# Patient Record
Sex: Female | Born: 1965 | Race: White | Hispanic: No | Marital: Married | Smoking: Current every day smoker
Health system: Southern US, Community
[De-identification: ages and names within clinical notes are randomized; demographics above are authoritative.]

## PROBLEM LIST (undated history)

## (undated) DIAGNOSIS — K219 Gastro-esophageal reflux disease without esophagitis: Secondary | ICD-10-CM

## (undated) DIAGNOSIS — IMO0001 Reserved for inherently not codable concepts without codable children: Secondary | ICD-10-CM

## (undated) DIAGNOSIS — G629 Polyneuropathy, unspecified: Secondary | ICD-10-CM

## (undated) DIAGNOSIS — F988 Other specified behavioral and emotional disorders with onset usually occurring in childhood and adolescence: Secondary | ICD-10-CM

## (undated) DIAGNOSIS — D649 Anemia, unspecified: Secondary | ICD-10-CM

## (undated) HISTORY — DX: Other specified behavioral and emotional disorders with onset usually occurring in childhood and adolescence: F98.8

## (undated) HISTORY — PX: TUBAL LIGATION: SHX77

## (undated) HISTORY — DX: Polyneuropathy, unspecified: G62.9

## (undated) HISTORY — PX: OTHER SURGICAL HISTORY: SHX169

---

## 2009-10-12 ENCOUNTER — Ambulatory Visit (HOSPITAL_BASED_OUTPATIENT_CLINIC_OR_DEPARTMENT_OTHER): Admission: RE | Admit: 2009-10-12 | Discharge: 2009-10-12 | Payer: Self-pay | Admitting: Podiatry

## 2010-09-18 ENCOUNTER — Emergency Department (HOSPITAL_COMMUNITY): Payer: Self-pay

## 2010-09-18 ENCOUNTER — Emergency Department (HOSPITAL_COMMUNITY)
Admission: EM | Admit: 2010-09-18 | Discharge: 2010-09-18 | Disposition: A | Discharge status: Home / Self Care | Payer: Self-pay | Attending: Emergency Medicine | Admitting: Emergency Medicine

## 2010-09-18 DIAGNOSIS — W010XXA Fall on same level from slipping, tripping and stumbling without subsequent striking against object, initial encounter: Secondary | ICD-10-CM | POA: Insufficient documentation

## 2010-09-18 DIAGNOSIS — M47812 Spondylosis without myelopathy or radiculopathy, cervical region: Secondary | ICD-10-CM | POA: Insufficient documentation

## 2010-09-18 DIAGNOSIS — S0990XA Unspecified injury of head, initial encounter: Secondary | ICD-10-CM | POA: Insufficient documentation

## 2010-09-18 DIAGNOSIS — R112 Nausea with vomiting, unspecified: Secondary | ICD-10-CM | POA: Insufficient documentation

## 2010-09-18 DIAGNOSIS — S0100XA Unspecified open wound of scalp, initial encounter: Secondary | ICD-10-CM | POA: Insufficient documentation

## 2010-09-23 ENCOUNTER — Emergency Department (HOSPITAL_COMMUNITY)
Admission: EM | Admit: 2010-09-23 | Discharge: 2010-09-23 | Disposition: A | Discharge status: Home / Self Care | Payer: Self-pay | Attending: Emergency Medicine | Admitting: Emergency Medicine

## 2010-09-23 DIAGNOSIS — Z4802 Encounter for removal of sutures: Secondary | ICD-10-CM | POA: Insufficient documentation

## 2010-10-17 LAB — POCT HEMOGLOBIN-HEMACUE: Hemoglobin: 14.8 g/dL (ref 12.0–15.0)

## 2011-09-25 ENCOUNTER — Telehealth: Payer: Self-pay | Admitting: *Deleted

## 2011-09-25 NOTE — Telephone Encounter (Signed)
Wrong patient.... 09/25/11@3 :32pm/LMB

## 2013-06-03 ENCOUNTER — Other Ambulatory Visit: Payer: Self-pay | Admitting: Family Medicine

## 2013-06-03 ENCOUNTER — Ambulatory Visit
Admission: RE | Admit: 2013-06-03 | Discharge: 2013-06-03 | Disposition: A | Discharge status: Home / Self Care | Payer: Managed Care, Other (non HMO) | Source: Ambulatory Visit | Attending: Family Medicine | Admitting: Family Medicine

## 2013-06-03 DIAGNOSIS — R52 Pain, unspecified: Secondary | ICD-10-CM

## 2013-06-16 ENCOUNTER — Encounter: Payer: Managed Care, Other (non HMO) | Admitting: Family Medicine

## 2013-06-16 ENCOUNTER — Encounter: Payer: Self-pay | Admitting: Family Medicine

## 2013-06-16 NOTE — Progress Notes (Signed)
Error   This encounter was created in error - please disregard. 

## 2013-06-16 NOTE — Progress Notes (Deleted)
Pre visit review using our clinic review tool, if applicable. No additional management support is needed unless otherwise documented below in the visit note. 

## 2013-10-20 ENCOUNTER — Ambulatory Visit (INDEPENDENT_AMBULATORY_CARE_PROVIDER_SITE_OTHER): Payer: Managed Care, Other (non HMO) | Admitting: Neurology

## 2013-10-20 ENCOUNTER — Encounter: Payer: Self-pay | Admitting: Neurology

## 2013-10-20 VITALS — BP 142/80 | HR 74 | Temp 98.0°F | Ht 67.0 in | Wt 234.1 lb

## 2013-10-20 DIAGNOSIS — R413 Other amnesia: Secondary | ICD-10-CM

## 2013-10-20 DIAGNOSIS — F909 Attention-deficit hyperactivity disorder, unspecified type: Secondary | ICD-10-CM

## 2013-10-20 NOTE — Patient Instructions (Addendum)
1. Bloodwork for TSH, free T4, B12 2. MRI brain without contrast 3. Routine EEG 4. Follow-up in 4 weeks

## 2013-10-20 NOTE — Progress Notes (Signed)
NEUROLOGY CONSULTATION NOTE  Amber Mcmahon MRN: [id number] DOB: 25-Nov-1965  Referring provider: Dr. Marisue Brooklyn Primary care provider: Dr. Kriste Basque  Reason for consult:  Neurological evaluation for poor memory, easy distractibility, poor concentration  Dear Dr Elisabeth Most:  Thank you for your kind referral of Amber Mcmahon for consultation of the above symptoms. Although her history is well known to you, please allow me to reiterate it for the purpose of our medical record. Records and images were personally reviewed where available.  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  She presents with easy distractibility, forgetfulness, and poor concentration/attention.  She reports this has been going on for "as long as I can remember," however she was not diagnosed until she went back to college in 2011.  Her instructors had a meeting about her because she was noted to have problems in class, she would get easily distracted if there is a noise, had difficulty sitting still.  She can sit quietly and listen to the teacher, however when asked a questions, she has "zero recollection" of what he just said.  She then states that she has no problems recalling what she needs in the grocery store or what she did the day before.  She constantly loses things. Periodically, people have to remind her about details of conversation and events.  She has been told by her husband that she repeats things excessively.  She denies missing medications or paying bills, stating she has a routine and keeps precise notes.  She also keeps notes or records conversations, otherwise she would not remember them.  She uses her GPS to drive, but would get slightly confused where to turn without it.  When asked about multi-tasking, she reports difficulty with this because she would get distracted by one activity, then  forget that she was going to do another thing.  She has noticed word-finding difficulties since her mid-40s, for instance she wanted to say "paper" but says "that white square thing sitting there."  She has no difficulties with ADLs, cooks without problems following written instructions, she has not left the stove on. She became tearful discussing her symptoms.  She reports that she had tried Vyvanse and Adderall for a while, with low doses seeming to minimally help for a few hours.  She was switched to Strattera which did not help at all, "like I was taking a vitamin."  She tapered off of this and stopped it last week.  She has also reduced her coffee intake to 2 double shots of espresso a day, with note of more difficulties with concentration and headaches when she took less coffee.  She has also significantly reduced Nexium intake to 4/week, in that past taking it 3-4 times a day.  She reports her daughter has the same inattentiveness.  Her grandmother had progressive dementia.    She denies any significant headaches except when missing caffeine, no dizziness, diplopia, dysarthria, dysphagia, focal numbness/tingling/weakness.  She has infrequent balance issues when standing. She has neck and back pain, no bowel/bladder dysfunction.  She denies any staring/unresponsive episodes, gaps in time, olfactory/gustatory hallucinations, rising epigastric sensation, myoclonic jerks.  She reports cold intolerance, hair loss, and a strong family history of thyroid disease.    She had a normal birth and early development.  There is no history of febrile convulsions, CNS infections, significant traumatic brain injury, family history of seizures.  She fell 2  years ago with very brief loss of consciousness and needed some staples in the back of her head.  PAST MEDICAL HISTORY: Past Medical History  Diagnosis Date  . ADD (attention deficit disorder)     PAST SURGICAL HISTORY: History reviewed. No pertinent past  surgical history.  MEDICATIONS: Nexium Tramadol   No current facility-administered medications on file prior to visit.    ALLERGIES: No Known Allergies  FAMILY HISTORY: History reviewed. No pertinent family history.  SOCIAL HISTORY: History   Social History  . Marital Status: Married    Spouse Name: N/A    Number of Children: N/A  . Years of Education: N/A   Occupational History  . Not on file.   Social History Main Topics  . Smoking status: Current Every Day Smoker -- 0.50 packs/day    Types: Cigarettes    Start date: 07/25/1971  . Smokeless tobacco: Not on file  . Alcohol Use: Yes  . Drug Use: No  . Sexual Activity: Not on file   Other Topics Concern  . Not on file   Social History Narrative  . No narrative on file    REVIEW OF SYSTEMS: Constitutional: No fevers, chills, or sweats, no generalized fatigue, change in appetite Eyes: No visual changes, double vision, eye pain Ear, nose and throat: No hearing loss, ear pain, nasal congestion, sore throat Cardiovascular: No chest pain, palpitations Respiratory:  No shortness of breath at rest or with exertion, wheezes GastrointestinaI: No nausea, vomiting, diarrhea, abdominal pain, fecal incontinence Genitourinary:  No dysuria, urinary retention or frequency Musculoskeletal:  No neck pain, back pain Integumentary: No rash, pruritus, skin lesions Neurological: as above Psychiatric: No depression, insomnia, anxiety Endocrine: No palpitations, fatigue, diaphoresis, mood swings, change in appetite, change in weight, increased thirst Hematologic/Lymphatic:  No anemia, purpura, petechiae. Allergic/Immunologic: no itchy/runny eyes, nasal congestion, recent allergic reactions, rashes  PHYSICAL EXAM: Filed Vitals:   10/20/13 1018  BP: 142/80  Pulse: 74  Temp: 98 F (36.7 C)   General: No acute distress Head:  Normocephalic/atraumatic Neck: supple, no paraspinal tenderness, full range of motion Back: No  paraspinal tenderness Heart: regular rate and rhythm Lungs: Clear to auscultation bilaterally. Vascular: No carotid bruits. Skin/Extremities: No rash, no edema Neurological Exam: Mental status: alert and oriented to person, place, and time, no dysarthria or aphasia, Fund of knowledge is appropriate.  Remote memory are intact.  Attention and concentration are normal.    Able to name objects and repeat phrases. MOCA 28/30 (missed 2 points for delayed recall). Cranial nerves: CN I: not tested CN II: pupils equal, round and reactive to light, visual fields intact, fundi unremarkable. CN III, IV, VI:  full range of motion, no nystagmus, no ptosis CN V: facial sensation intact CN VII: upper and lower face symmetric CN VIII: hearing intact CN IX, X: gag intact, uvula midline CN XI: sternocleidomastoid and trapezius muscles intact CN XII: tongue midline Bulk & Tone: normal, no fasciculations. Motor: 5/5 throughout with no pronator drift. Sensation: intact to light touch, cold, pin, vibration and joint position sense.  No extinction to double simultaneous stimulation.  Romberg test negative Deep Tendon Reflexes: +2 throughout, no ankle clonus Plantar responses: downgoing bilaterally Cerebellar: no incoordination on finger to nose, heel to shin. No dysdiadochokinesia Gait: narrow-based and steady, able to tandem walk adequately. Tremor: none  IMPRESSION: This is a 48 year old right-handed woman with a history of ADHD presenting for neurological evaluation of forgetfulness, inattention, and easy distractibility, unresponsive to ADHD medications.  Her MOCA score is within normal, with missed points for delayed recall.  Her neurological exam is normal.  We discussed memory changes, an MRI brain will be ordered to assess for underlying structural abnormality that could potentially cause symptoms, check TSH, T4, B12. Seizures can also cause memory issues, she does not have clear epilepsy risk factors or  other seizure-like symptoms aside from memory problems.  A routine EEG will be ordered.  We discussed symptoms most likely related to the ADD, however primary neurological cause of memory change will be ruled out.  She may benefit from neuropsychological evaluation if not yet done.  She will follow-up after the tests.    Thank you for allowing me to participate in the care of this patient. Please do not hesitate to call for any questions or concerns.   Patrcia DollyKaren Kamar Callender, M.D.

## 2013-10-21 LAB — T4, FREE: Free T4: 1.04 ng/dL (ref 0.80–1.80)

## 2013-10-21 LAB — TSH: TSH: 1.77 u[IU]/mL (ref 0.350–4.500)

## 2013-10-21 LAB — VITAMIN B12: Vitamin B-12: 764 pg/mL (ref 211–911)

## 2013-10-29 ENCOUNTER — Ambulatory Visit (HOSPITAL_COMMUNITY)
Admission: RE | Admit: 2013-10-29 | Discharge: 2013-10-29 | Disposition: A | Discharge status: Home / Self Care | Payer: Managed Care, Other (non HMO) | Source: Ambulatory Visit | Attending: Neurology | Admitting: Neurology

## 2013-10-29 DIAGNOSIS — R413 Other amnesia: Secondary | ICD-10-CM

## 2013-10-29 NOTE — Progress Notes (Signed)
EEG Completed; Results Pending  

## 2013-11-04 NOTE — Procedures (Signed)
ELECTROENCEPHALOGRAM REPORT  Date of Study: 10/29/2013  Patient's Name: Amber Mcmahon MRN: [id number] Date of Birth: 1965-12-20  Referring Provider: Dr. Patrcia DollyKaren Hartleigh Mcmahon  Clinical History: This is a 48 year old right-handed woman with a history of ADHD presenting for neurological evaluation of forgetfulness, inattention, and easy distractibility, unresponsive to ADHD medications   Medications: Tramadol  Technical Summary: A multichannel digital EEG recording measured by the international 10-20 system with electrodes applied with paste and impedances below 5000 ohms performed in our laboratory with EKG monitoring in an awake and drowsy patient.  Hyperventilation and photic stimulation were performed.  The digital EEG was referentially recorded, reformatted, and digitally filtered in a variety of bipolar and referential montages for optimal display.  Spike detection software was employed.  Description: The patient is awake and drowsy during the recording.  During maximal wakefulness, there is a symmetric, medium voltage 11-11.5 Hz posterior dominant rhythm that attenuates with eye opening.  The record is symmetric.  During drowsiness, there is an increase in theta slowing of the background, with shifting asymmetry seen over the bilateral temporal regions, left greater than right. Hyperventilation and photic stimulation did not elicit any abnormalities.  There were no epileptiform discharges or electrographic seizures seen.    EKG lead was unremarkable.  Impression: This awake and drowsy EEG is within normal limits.  Clinical Correlation: A normal EEG does not exclude a clinical diagnosis of epilepsy.  Clinical correlation is advised.   Amber Mcmahon, M.D.

## 2013-11-07 ENCOUNTER — Ambulatory Visit (HOSPITAL_COMMUNITY): Payer: Managed Care, Other (non HMO)

## 2013-11-17 ENCOUNTER — Ambulatory Visit (HOSPITAL_COMMUNITY): Admission: RE | Admit: 2013-11-17 | Payer: Managed Care, Other (non HMO) | Source: Ambulatory Visit

## 2014-01-14 ENCOUNTER — Ambulatory Visit (INDEPENDENT_AMBULATORY_CARE_PROVIDER_SITE_OTHER): Payer: Managed Care, Other (non HMO) | Admitting: Neurology

## 2014-01-14 ENCOUNTER — Encounter: Payer: Self-pay | Admitting: Neurology

## 2014-01-14 ENCOUNTER — Encounter (INDEPENDENT_AMBULATORY_CARE_PROVIDER_SITE_OTHER): Payer: Self-pay

## 2014-01-14 VITALS — BP 123/79 | HR 68 | Resp 15 | Ht 65.75 in | Wt 228.0 lb

## 2014-01-14 DIAGNOSIS — G5790 Unspecified mononeuropathy of unspecified lower limb: Secondary | ICD-10-CM | POA: Insufficient documentation

## 2014-01-14 DIAGNOSIS — G5791 Unspecified mononeuropathy of right lower limb: Secondary | ICD-10-CM

## 2014-01-14 NOTE — Progress Notes (Signed)
[facility]   Provider:  Melvyn Novasarmen  Emmersyn Kratzke, [place] D  Referring Provider: Terressa KoyanagiKim, Hannah R., DO Primary Care Physician:  Astrid DivineGRIFFIN,ELAINE COLLINS, MD  Chief Complaint  Patient presents with  . New Evaluation    Room 11  . Neuritis    HPI:  Amber AveCharlene Mcmahon is a 48 y.o. female  Is seen here as a referral  from the [facility], Merwyn KatosJohn Petery, podiatrist.  The patient has just been seen by a neurologist, Dr. Karel JarvisAquino, at [facility] - for memory and attention problems and has not completed her work up yet. She was referred to Dr. Aletha HalimAndreas Runheim per podiatrist.  Amber Mcmahon  Presents with long and protracted history of right-sided foot pain. The patient describes that the first injury to the right foot occurred when she tripped over a root and twisted her ankle , 5 years ago. A year after the injury , surgery followed and she lost insurance and follow up . Surgery has not helped. The condition is chronic for over 5 years . Only 2 month ago, while she was on her honeymoon in [place] , she tripped again. She tripped while dancing, and has had several falls.  The pain was unchanged , just her feeling of clumsiness has increased. She feels pain, and is constantly aware of her foot - at times the foot dropped or "got stuck". She has been given frequent injections into the heel and external ankle, but these have not worked or only worked temporarily to suppress pain.    She has a history of multifocal pain, muscular, joint and tendonitis.  ADHD, seen by Dr. Karel JarvisAquino.  She is a massage therapist, smoker, newly married.  2 biological and one adopted child form a former marriage .    Review of Systems: Out of a complete 14 system review, the patient complains of only the following symptoms, and all other reviewed systems are negative. Foot pain, falls   History   Social History  . Marital Status: Married    Spouse Name: Amber JohnWarren    Number of Children: 3  . Years of  Education: College   Occupational History  . Not on file.   Social History Main Topics  . Smoking status: Current Every Day Smoker -- 0.50 packs/day    Types: Cigarettes    Start date: 07/25/1971  . Smokeless tobacco: Never Used  . Alcohol Use: Yes     Comment: occas.  . Drug Use: No  . Sexual Activity: Not on file   Other Topics Concern  . Not on file   Social History Narrative   Patient is married Amber John(Warren) and lives at home with her husband.   Patient is self-employed.   Patient has three children.   Patient has a college education.   Patient is right-handed.   Patient drinks 5 cups of coffee daily.    Family History  Problem Relation Age of Onset  . Dementia Maternal Grandmother     Past Medical History  Diagnosis Date  . ADD (attention deficit disorder)   . Neuropathy     Past Surgical History  Procedure Laterality Date  . Foot/ankle surgery Right     Current Outpatient Prescriptions  Medication Sig Dispense Refill  . amphetamine-dextroamphetamine (ADDERALL XR) 25 MG 24 hr capsule 1 capsule daily.      . ranitidine (ZANTAC) 300 MG tablet Take 300 mg by mouth at bedtime.       No current facility-administered medications for this  visit.    Allergies as of 01/14/2014  . (No Known Allergies)    Vitals: BP 123/79  Pulse 68  Resp 15  Ht 5' 5.75" (1.67 m)  Wt 228 lb (103.42 kg)  BMI 37.08 kg/m2 Last Weight:  Wt Readings from Last 1 Encounters:  01/14/14 228 lb (103.42 kg)   Last Height:   Ht Readings from Last 1 Encounters:  01/14/14 5' 5.75" (1.67 m)    Physical exam:  General: The patient is awake, alert and appears not in acute distress. The patient is well groomed. Head: Normocephalic, atraumatic. Neck is supple. Mallampati 3 , neck circumference:17 inches, no TMJ  Cardiovascular:  Regular rate and rhythm , without  murmurs or carotid bruit, and without distended neck veins. Respiratory: Lungs are clear to auscultation. Skin:  Without  evidence of edema, or rash Trunk: BMI is elevated and patient  has normal posture.  Neurologic exam : The patient is awake and alert, oriented to place and time.  She is extroverted, speaks with vivid mimic, attention span & concentration ability were not adressed . Speech is fluent, dramatic  without dysarthria, dysphonia or aphasia. Mood and affect are appropriate.  Cranial nerves: Pupils are equal and briskly reactive to light. Funduscopic exam without  evidence of pallor or edema. Extraocular movements  in vertical and horizontal planes intact and without nystagmus. Visual fields by finger perimetry are intact. Hearing to finger rub intact.  Facial sensation intact to fine touch. Facial motor strength is symmetric and tongue and uvula move midline.  Motor exam:   Normal foot ROM, toe movements and inversion, exertion. No foot drop noted.   Sensory:  Fine touch, pinprick and vibration were tested in all extremities, vibration decreased at external right ankle. Proprioception is  normal.  Coordination: Rapid alternating movements in the fingers/hands is tested and normal.  Finger-to-nose maneuver tested and normal without evidence of ataxia, dysmetria or tremor.  Gait and station: Patient walks without assistive device and to climb up to the exam table.  Foot position on the left is inverted,  Strength within normal limits. Stance is  Wider basd, feet are flat , no hammertoes. stable and normal. Tandem gait is unsteady , fragmented. Component of astasia, abasia.  Deep tendon reflexes: in the  upper and lower extremities are symmetric and intact. Babinski maneuver response is  downgoing.   Assessment:  After physical and neurologic examination, review of laboratory studies, imaging, neurophysiology testing and pre-existing records, assessment is   1) status post foot surgery with remaining pain, will evaluate for neuropathy by NCS and EMG (peroneal ) and refer this work up to  one of the  neuromuscular specialists in my office.   Plan:  Treatment plan and additional workup : start a Vit B complex, and NCS and EMG are ordered.

## 2014-01-14 NOTE — Patient Instructions (Signed)

## 2014-01-15 LAB — NEUROPATHY PANEL
A/G Ratio: 1.7 (ref 0.7–2.0)
Albumin ELP: 4.1 g/dL (ref 3.2–5.6)
Alpha 1: 0.1 g/dL (ref 0.1–0.4)
Alpha 2: 0.6 g/dL (ref 0.4–1.2)
Angio Convert Enzyme: 38 U/L (ref 14–82)
Anti Nuclear Antibody(ANA): NEGATIVE
Beta: 1.1 g/dL (ref 0.6–1.3)
Gamma Globulin: 0.6 g/dL (ref 0.5–1.6)
Globulin, Total: 2.4 g/dL (ref 2.0–4.5)
Rhuematoid fact SerPl-aCnc: 12.3 IU/mL (ref 0.0–13.9)
Sed Rate: 7 mm/hr (ref 0–40)
TSH: 1.67 u[IU]/mL (ref 0.450–4.500)
Total Protein: 6.5 g/dL (ref 6.0–8.5)
Vit D, 25-Hydroxy: 23.4 ng/mL — ABNORMAL LOW (ref 30.0–100.0)
Vitamin B-12: 587 pg/mL (ref 211–946)

## 2014-01-16 NOTE — Progress Notes (Signed)
Quick Note:  Spoke to patient and relayed lab results, low Vit D level. The patient is to take OTC Vitamin D, 2000 IU twice a day until recheck, per Dr. Vickey Hugerohmeier. ______

## 2014-01-22 ENCOUNTER — Other Ambulatory Visit: Payer: Self-pay | Admitting: Family Medicine

## 2014-01-22 DIAGNOSIS — M7989 Other specified soft tissue disorders: Secondary | ICD-10-CM

## 2014-01-22 DIAGNOSIS — R52 Pain, unspecified: Secondary | ICD-10-CM

## 2014-01-28 ENCOUNTER — Ambulatory Visit
Admission: RE | Admit: 2014-01-28 | Discharge: 2014-01-28 | Disposition: A | Discharge status: Home / Self Care | Payer: Managed Care, Other (non HMO) | Source: Ambulatory Visit | Attending: Family Medicine | Admitting: Family Medicine

## 2014-01-28 DIAGNOSIS — R52 Pain, unspecified: Secondary | ICD-10-CM

## 2014-01-28 DIAGNOSIS — M7989 Other specified soft tissue disorders: Secondary | ICD-10-CM

## 2014-01-29 ENCOUNTER — Telehealth: Payer: Self-pay | Admitting: *Deleted

## 2014-01-29 NOTE — Telephone Encounter (Signed)
Dr. Anne HahnWillis has appointment and needs 7/21 4pm EMG slot blocked/rescheduled. Left message for patient to call back to reschedule visit.

## 2014-02-10 ENCOUNTER — Encounter: Payer: Managed Care, Other (non HMO) | Admitting: Neurology

## 2014-02-12 ENCOUNTER — Telehealth: Payer: Self-pay | Admitting: *Deleted

## 2014-02-12 NOTE — Telephone Encounter (Signed)
Patient returning my call, appointment was r/s for 02/24/14 at 2:30 pm.

## 2014-02-12 NOTE — Telephone Encounter (Signed)
Called patient to r/s appointment, patient has EMG/NCS scheduled for 02/18/14, her appointment needs to be after the NCS, left message to return the call.

## 2014-02-13 ENCOUNTER — Ambulatory Visit: Payer: Managed Care, Other (non HMO) | Admitting: Adult Health

## 2014-02-18 ENCOUNTER — Ambulatory Visit (INDEPENDENT_AMBULATORY_CARE_PROVIDER_SITE_OTHER): Payer: Managed Care, Other (non HMO) | Admitting: Neurology

## 2014-02-18 ENCOUNTER — Encounter (INDEPENDENT_AMBULATORY_CARE_PROVIDER_SITE_OTHER): Payer: Self-pay

## 2014-02-18 DIAGNOSIS — M79671 Pain in right foot: Secondary | ICD-10-CM

## 2014-02-18 DIAGNOSIS — Z0289 Encounter for other administrative examinations: Secondary | ICD-10-CM

## 2014-02-18 DIAGNOSIS — M79609 Pain in unspecified limb: Secondary | ICD-10-CM

## 2014-02-18 DIAGNOSIS — G5791 Unspecified mononeuropathy of right lower limb: Secondary | ICD-10-CM

## 2014-02-18 NOTE — Procedures (Signed)
     HISTORY:  Amber Mcmahon is a 48 year old patient with a history of right foot pain with weightbearing. The patient had surgery on the right foot 3 years ago, and she has had ongoing discomfort. The patient is being evaluated for possible neuropathy or a lumbosacral radiculopathy. The patient denies any significant back pain or pain down the leg.  NERVE CONDUCTION STUDIES:  Nerve conduction studies were performed on both lower extremities. The distal motor latencies and motor amplitudes for the peroneal and posterior tibial nerves were within normal limits. The nerve conduction velocities for these nerves were also normal. The H reflex latencies were normal. The sensory latencies for the peroneal nerves, and the medial and lateral plantar sensory latencies were within normal limits bilaterally.   EMG STUDIES:  EMG study was performed on the right lower extremity:  The tibialis anterior muscle reveals 2 to 4K motor units with full recruitment. No fibrillations or positive waves were seen. The peroneus tertius muscle reveals 2 to 5K motor units with decreased recruitment. No fibrillations or positive waves were seen. The medial gastrocnemius muscle reveals 1 to 3K motor units with full recruitment. No fibrillations or positive waves were seen. The vastus lateralis muscle reveals 2 to 4K motor units with full recruitment. No fibrillations or positive waves were seen. The iliopsoas muscle reveals 2 to 4K motor units with full recruitment. No fibrillations or positive waves were seen. The biceps femoris muscle (long head) reveals 2 to 3K motor units with full recruitment. No fibrillations or positive waves were seen. The lumbosacral paraspinal muscles were tested at 3 levels, and revealed no abnormalities of insertional activity at all 3 levels tested. There was good relaxation.   IMPRESSION:  Nerve studies done on both lower extremities were within normal limits. No evidence of a  peripheral neuropathy is seen. EMG evaluation of the right lower extremity is relatively unremarkable, without clear evidence of an overlying lumbosacral radiculopathy.  Marlan Palau. Keith Sarra Rachels MD 02/18/2014 1:50 PM  [facility] [address] [place], [place] [id number]  Phone [phone] Fax [phone]

## 2014-02-24 ENCOUNTER — Ambulatory Visit (INDEPENDENT_AMBULATORY_CARE_PROVIDER_SITE_OTHER): Payer: Managed Care, Other (non HMO) | Admitting: Adult Health

## 2014-02-24 ENCOUNTER — Encounter: Payer: Self-pay | Admitting: Adult Health

## 2014-02-24 ENCOUNTER — Telehealth: Payer: Self-pay | Admitting: Adult Health

## 2014-02-24 VITALS — BP 128/78 | HR 66 | Ht 65.0 in | Wt 226.0 lb

## 2014-02-24 DIAGNOSIS — R52 Pain, unspecified: Secondary | ICD-10-CM

## 2014-02-24 DIAGNOSIS — G5790 Unspecified mononeuropathy of unspecified lower limb: Secondary | ICD-10-CM

## 2014-02-24 DIAGNOSIS — G5791 Unspecified mononeuropathy of right lower limb: Secondary | ICD-10-CM

## 2014-02-24 MED ORDER — DULOXETINE HCL 30 MG PO CPEP: ORAL_CAPSULE | ORAL | Status: DC

## 2014-02-24 NOTE — Telephone Encounter (Signed)
Vicky with [facility] @ [phone], wanted to make [facility] aware that pt requested recent labs per Dr. Oliva Bustardohmeier's request.  They have not drawn any blood work.  If you have questions please feel free to contact her.  Thanks

## 2014-02-24 NOTE — Patient Instructions (Signed)

## 2014-02-24 NOTE — Progress Notes (Signed)
PATIENT: Amber Mcmahon DOB: 1966/03/12  REASON FOR VISIT: follow up HISTORY FROM: patient  HISTORY OF PRESENT ILLNESS: Amber Mcmahon is a 48 year old female with a history of right foot discomfort. She returns today for follow up. Patient had NCS with EMG that was unremarkable. Blood work was normal except a low Vit D level. She reports that her foot pain has continued. Patient is very tearful and states that she has pain all over. Patient states that she has had pain in the left arm and found a lump in the upper part of the arm. She had scans done but they were unremarkable. The pain eventually went away in the left arm but moved to the right arm. Patient states that she has not slept on the right side in some time. Her PCP has ordered an MRI of that shoulder. She is now having pain back in the left shoulder region. She states that the right shoulder is no longer hurting like it was. She states that her skin is very sensitive to touch. This occurs mainly in the shoulder region and upper arm bilaterally.  Patient states that she does have some numbness and tingling in the right foot but states that it is not constant. Patient has been seen by various physicians over the last couple of months.   REVIEW OF SYSTEMS: Full 14 system review of systems performed and notable only for:  Constitutional: Excessive sweating Eyes: N/A Ear/Nose/Throat: N/A  Skin: N/A  Cardiovascular: Leg swelling Respiratory: N/A  Gastrointestinal: N/A  Genitourinary: N/A Hematology/Lymphatic: N/A  Endocrine: Cold intolerance, heat intolerance Musculoskeletal: Joint pain, joint swelling, back pain, aching muscles, muscle cramps, walking difficulty Allergy/Immunology: N/A  Neurological: N/A Psychiatric: N/A Sleep: N/A   ALLERGIES: No Known Allergies  HOME MEDICATIONS: Outpatient Prescriptions Prior to Visit  Medication Sig Dispense Refill  . amphetamine-dextroamphetamine (ADDERALL XR) 25 MG 24 hr capsule 1  capsule daily.      . ranitidine (ZANTAC) 300 MG tablet Take 300 mg by mouth at bedtime.       No facility-administered medications prior to visit.    PAST MEDICAL HISTORY: Past Medical History  Diagnosis Date  . ADD (attention deficit disorder)   . Neuropathy     PAST SURGICAL HISTORY: Past Surgical History  Procedure Laterality Date  . Foot/ankle surgery Right     FAMILY HISTORY: Family History  Problem Relation Age of Onset  . Dementia Maternal Grandmother     SOCIAL HISTORY: History   Social History  . Marital Status: Married    Spouse Name: Amber Mcmahon    Number of Children: 3  . Years of Education: College   Occupational History  .      teacher,massage therapy   Social History Main Topics  . Smoking status: Current Every Day Smoker -- 1.00 packs/day    Types: Cigarettes    Start date: 07/25/1971  . Smokeless tobacco: Never Used  . Alcohol Use: Yes     Comment: occas.  . Drug Use: No  . Sexual Activity: Not on file   Other Topics Concern  . Not on file   Social History Narrative   Patient is married Amber Mcmahon) and lives at home with her husband.   Patient is self-employed.   Patient has three children.   Patient has a college education.   Patient is right-handed.   Patient drinks 5 cups of coffee daily.      PHYSICAL EXAM  Filed Vitals:   02/24/14 1440  BP: 128/78  Pulse: 66  Height: 5\' 5"  (1.651 m)  Weight: 226 lb (102.513 kg)   Body mass index is 37.61 kg/(m^2).  Generalized: Well developed, very tearful.   Neurological examination  Mentation: Alert oriented to time, place, history taking. Follows all commands speech and language fluent Cranial nerve II-XII:  Extraocular movements were full, visual field were full on confrontational test.  Head turning and shoulder shrug  were normal and symmetric. Motor: The motor testing reveals 5 over 5 strength of all 4 extremities. Good symmetric motor tone is noted throughout.  Tenderness is noted  when I pressed down on elbows and when lifting up the knee. Pain with flexion and extension of neck. Sensory: Sensory testing is intact to soft touch on all 4 extremities. No evidence of extinction is noted.  Coordination: Cerebellar testing reveals good finger-nose-finger and heel-to-shin bilaterally.  Gait and station: Gait is slightly wide based. Tandem gait is unsteady. Romberg is negative but unsteady. No drift is seen.  Reflexes: Deep tendon reflexes are symmetric and normal bilaterally.     DIAGNOSTIC DATA (LABS, IMAGING, TESTING) - I reviewed patient records, labs, notes, testing and imaging myself where available.  Lab Results  Component Value Date   HGB 14.8 10/12/2009      Component Value Date/Time   PROT 6.5 01/14/2014 1112    Lab Results  Component Value Date   VITAMINB12 587 01/14/2014   Lab Results  Component Value Date   TSH 1.670 01/14/2014      ASSESSMENT AND PLAN 48 y.o. year old female  has a past medical history of ADD (attention deficit disorder) and Neuropathy. here with:  1. Neuritis of right foot 2. Generalized pain  Patient is very tearful. She states that she has pain "all over." She is seeing us for right foot pain but states that recently she has been having pain in the right and left arm that will alternate. She has had a "scan" of her left arm that was unremarkable according to the patient. She has an MRI scheduled for the right shoulder. She continues to have pain in the right foot. NCS with EMG was normal. She has had blood work that was unremarkable. Rheumatoid factor, angiotensin-converting enzyme, sedimentation rate were all normal. The patient's primary care provider has also done a series of scans and blood work on the patient. I currently do not have access to these test, I have requested that the patient get this information sent to us. The patient currently takes Vicodin and ibuprofen to help with the pain. Her symptoms could represent a  chronic pain syndrome such as fibromyalgia. I will give the patient a trial on Cymbalta to see if it is beneficial for her pain.  Patient should follow up in 4 months or sooner if needed.   Amber PennyMegan Willaim Mode, MSN, NP-C 02/24/2014, 2:42 PM [facility] [address], [address] [place], [place] [address] [phone]  Note: This document was prepared with digital dictation and possible smart phrase technology. Any transcriptional errors that result from this process are unintentional.

## 2014-02-24 NOTE — Telephone Encounter (Signed)
Noted  

## 2014-02-25 NOTE — Progress Notes (Signed)
I agree with the assessment and plan as directed by NP .The patient is known to me .   Hadlea Furuya, MD  

## 2014-03-10 ENCOUNTER — Telehealth: Payer: Self-pay | Admitting: *Deleted

## 2014-03-10 DIAGNOSIS — G5790 Unspecified mononeuropathy of unspecified lower limb: Secondary | ICD-10-CM

## 2014-03-10 NOTE — Telephone Encounter (Signed)
Pt called.  Stated that she wanted an appt with Dr. Vickey Hugerohmeier.  Saw Tylene FantasiaMegan M, NP but felt like she did not address her issues.  Looking at the chart, I see that labs (only Vitamin D low ), and [place]/EMG normal. Started on cymbalta for fibromylgia sx.

## 2014-03-11 NOTE — Telephone Encounter (Signed)
I called pt and let her know that I did not see request for other labs.  (RA, gout?).  She wanted to see Dr. Vickey Huger again after her initial appt as Aundra Millet M/NP did not address these other labs.  She relayed that someone told her that these would be addressed.  Who?  I do not see note in chart regarding this.  Other MD?   I faxed to Dr. Elvin So, [facility] notes, labs and procedure done on pt.  Will check with Dr.Dohmeier on appt and then call her back.

## 2014-03-12 NOTE — Telephone Encounter (Signed)
Called and LMVM for pt that I spoke to Dr. Vickey Hugerohmeier.  She will order the test for gout, and other for autoimmune (Sjogrens syndrome).  Labs to be drawn in office.  Dr. Vickey Hugerohmeier to speak to her when in for labs.  Appt to be set when Dr. Vickey Hugerohmeier here.

## 2014-03-13 ENCOUNTER — Other Ambulatory Visit: Payer: Self-pay

## 2014-03-19 ENCOUNTER — Other Ambulatory Visit: Payer: Self-pay

## 2014-03-20 ENCOUNTER — Other Ambulatory Visit (INDEPENDENT_AMBULATORY_CARE_PROVIDER_SITE_OTHER): Payer: Self-pay

## 2014-03-20 DIAGNOSIS — G5790 Unspecified mononeuropathy of unspecified lower limb: Secondary | ICD-10-CM

## 2014-03-20 DIAGNOSIS — Z0289 Encounter for other administrative examinations: Secondary | ICD-10-CM

## 2014-03-22 LAB — SJOGREN'S SYNDROME ANTIBODS(SSA + SSB)
ENA SSA (RO) Ab: <0.2 AI (ref 0.0–0.9)
ENA SSB (LA) Ab: <0.2 AI (ref 0.0–0.9)

## 2014-03-22 LAB — URIC ACID: Uric Acid: 7.4 mg/dL — ABNORMAL HIGH (ref 2.5–7.1)

## 2014-04-03 ENCOUNTER — Telehealth: Payer: Self-pay | Admitting: *Deleted

## 2014-04-03 NOTE — Telephone Encounter (Signed)
Patient was given her lab results and faxed to Dr. Valentina Lucks to address the uric acid level.

## 2014-04-03 NOTE — Telephone Encounter (Signed)
Left a voice message for the patient to call the office for lab results.

## 2014-04-03 NOTE — Telephone Encounter (Signed)
Message copied by Carolann Littler on Fri Apr 03, 2014  4:31 PM ------      Message from: [facility], CARMEN      Created: Wed Apr 01, 2014  4:31 PM       The elevated uric acid speaks for a higher risk of Gout,  You tested negative for sjogren's disease .      CD ------

## 2014-06-30 ENCOUNTER — Ambulatory Visit: Payer: Managed Care, Other (non HMO) | Admitting: Adult Health

## 2014-08-01 ENCOUNTER — Encounter (HOSPITAL_COMMUNITY): Payer: Self-pay | Admitting: Emergency Medicine

## 2014-08-01 ENCOUNTER — Emergency Department (HOSPITAL_COMMUNITY): Payer: Managed Care, Other (non HMO)

## 2014-08-01 ENCOUNTER — Emergency Department (HOSPITAL_COMMUNITY)
Admission: EM | Admit: 2014-08-01 | Discharge: 2014-08-01 | Disposition: A | Discharge status: Home / Self Care | Payer: Managed Care, Other (non HMO) | Attending: Emergency Medicine | Admitting: Emergency Medicine

## 2014-08-01 DIAGNOSIS — G629 Polyneuropathy, unspecified: Secondary | ICD-10-CM | POA: Diagnosis not present

## 2014-08-01 DIAGNOSIS — Z79899 Other long term (current) drug therapy: Secondary | ICD-10-CM | POA: Insufficient documentation

## 2014-08-01 DIAGNOSIS — F909 Attention-deficit hyperactivity disorder, unspecified type: Secondary | ICD-10-CM | POA: Insufficient documentation

## 2014-08-01 DIAGNOSIS — M549 Dorsalgia, unspecified: Secondary | ICD-10-CM | POA: Diagnosis present

## 2014-08-01 DIAGNOSIS — M5417 Radiculopathy, lumbosacral region: Secondary | ICD-10-CM | POA: Diagnosis not present

## 2014-08-01 DIAGNOSIS — Z720 Tobacco use: Secondary | ICD-10-CM | POA: Insufficient documentation

## 2014-08-01 MED ORDER — OXYCODONE-ACETAMINOPHEN 5-325 MG PO TABS: 1.0000 | ORAL_TABLET | ORAL | Status: DC | PRN

## 2014-08-01 MED ORDER — DIAZEPAM 5 MG PO TABS
5.0000 mg | ORAL_TABLET | Freq: Once | ORAL | Status: AC
  Administered 2014-08-01: 5 mg via ORAL
  Filled 2014-08-01: qty 1

## 2014-08-01 MED ORDER — OXYCODONE-ACETAMINOPHEN 5-325 MG PO TABS
1.0000 | ORAL_TABLET | Freq: Once | ORAL | Status: AC
  Administered 2014-08-01: 1 via ORAL
  Filled 2014-08-01: qty 1

## 2014-08-01 MED ORDER — DEXAMETHASONE SODIUM PHOSPHATE 10 MG/ML IJ SOLN
10.0000 mg | Freq: Once | INTRAMUSCULAR | Status: AC
  Administered 2014-08-01: 10 mg via INTRAMUSCULAR
  Filled 2014-08-01: qty 1

## 2014-08-01 MED ORDER — HYDROMORPHONE HCL 1 MG/ML IJ SOLN
1.0000 mg | Freq: Once | INTRAMUSCULAR | Status: AC
  Administered 2014-08-01: 1 mg via INTRAMUSCULAR
  Filled 2014-08-01: qty 1

## 2014-08-01 MED ORDER — DIAZEPAM 5 MG PO TABS: 5.0000 mg | ORAL_TABLET | Freq: Three times a day (TID) | ORAL | Status: DC | PRN

## 2014-08-01 NOTE — ED Provider Notes (Signed)
CSN: [id number]     Arrival date & time 08/01/14  7829 History   First MD Initiated Contact with Patient 08/01/14 [phone]     Chief Complaint  Patient presents with  . Leg Pain     (Consider location/radiation/quality/duration/timing/severity/associated sxs/prior Treatment) HPI Amber Mcmahon is a 49 y.o. female who presents to ED with complaint of back pain. Pt states she has had back issues for several moths. States she is also currently followed by orthopedist for the "nerve problem in the foot." Patient states that over the summer she did a lot of heavy yard work, and that is when her pain started. She states that this morning pain is significantly worse than before. Patient states pain is radiating from the lower back to bilateral buttock. States she is having tingling and numbness sensation in bilateral lateral calfs.  She denies any symptoms in her thighs. She states that yesterday she took down her Christmas lights outside, states she did a lot of bending and repetitive motions and thinks she may have aggravated it. She denies any weakness in her extremities. No loss of bladder or bowel control. No fever. No abdominal pain. No urinary symptoms. States she iced yesterday, no treatment today prior to coming in.   Past Medical History  Diagnosis Date  . ADD (attention deficit disorder)   . Neuropathy    Past Surgical History  Procedure Laterality Date  . Foot/ankle surgery Right    Family History  Problem Relation Age of Onset  . Dementia Maternal Grandmother   . Gout Father    History  Substance Use Topics  . Smoking status: Current Every Day Smoker -- 0.50 packs/day    Types: Cigarettes    Start date: 07/25/1971  . Smokeless tobacco: Never Used  . Alcohol Use: Yes     Comment: occas.   OB History    No data available     Review of Systems  Constitutional: Negative for fever and chills.  Respiratory: Negative for cough, chest tightness and shortness of breath.    Cardiovascular: Negative for chest pain, palpitations and leg swelling.  Gastrointestinal: Positive for abdominal pain. Negative for nausea, vomiting and diarrhea.  Genitourinary: Negative for dysuria and flank pain.  Musculoskeletal: Positive for back pain and arthralgias. Negative for neck pain and neck stiffness.  Skin: Negative for rash.  Neurological: Positive for numbness. Negative for dizziness, weakness and headaches.  All other systems reviewed and are negative.     Allergies  Review of patient's allergies indicates no known allergies.  Home Medications   Prior to Admission medications   Medication Sig Start Date End Date Taking? Authorizing Provider  amphetamine-dextroamphetamine (ADDERALL XR) 25 MG 24 hr capsule 1 capsule daily. 12/31/13   Historical Provider, MD  DULoxetine (CYMBALTA) 30 MG capsule Take 1 tablet daily for one week, then increase to 1 tablet twice a day thereafter. 02/24/14   Butch Penny, NP  HYDROcodone-acetaminophen (NORCO/VICODIN) 5-325 MG per tablet Take 1 tablet by mouth every 6 (six) hours as needed for moderate pain.    Historical Provider, MD  ranitidine (ZANTAC) 300 MG tablet Take 300 mg by mouth at bedtime.    Historical Provider, MD   BP 149/83 mmHg  Pulse 79  Temp(Src) 97.9 F (36.6 C) (Oral)  Resp 16  SpO2 100% Physical Exam  Constitutional: She is oriented to person, place, and time. She appears well-developed and well-nourished. No distress.  HENT:  Head: Normocephalic.  Eyes: Conjunctivae are normal.  Neck: Neck supple.  Cardiovascular: Normal rate, regular rhythm and normal heart sounds.   Pulmonary/Chest: Effort normal and breath sounds normal. No respiratory distress. She has no wheezes. She has no rales.  Abdominal: Soft. Bowel sounds are normal. She exhibits no distension. There is no tenderness. There is no rebound.  Musculoskeletal: She exhibits no edema.  Midline lumbar spine tenderness. Pain with bilateral straight leg  raise  Neurological: She is alert and oriented to person, place, and time.  5/5 and equal lower extremity strength. 2+ and equal patellar reflexes bilaterally. Pt able to dorsiflex bilateral toes and feet with good strength against resistance. Equal sensation bilaterally over thighs and lower legs.    Skin: Skin is warm and dry.  Psychiatric: She has a normal mood and affect. Her behavior is normal.  Nursing note and vitals reviewed.   ED Course  Procedures (including critical care time) Labs Review Labs Reviewed - No data to display  Imaging Review Dg Lumbar Spine Complete  08/01/2014   CLINICAL DATA:  Low back pain with acute onset radicular type symptoms  EXAM: LUMBAR SPINE - COMPLETE 4+ VIEW  COMPARISON:  May 15, 2011  FINDINGS: Frontal, lateral, spot lumbosacral lateral, and bilateral oblique views were obtained. The there are 5 non-rib-bearing lumbar type vertebral bodies. There is no appreciable fracture. There is 5 mm of anterolisthesis of L4 on L5. There is no other spondylolisthesis. There is moderate disc space narrowing throughout the lower thoracic spine as well as at L1-2 and L2-3, stable. There are anterior osteophytes in the lower lumbar spine as well as at L1 and L2, stable. There is facet osteoarthritic change at L4-5 and L5-S1 bilaterally.  IMPRESSION: Multilevel osteoarthritic change. Mild spondylolisthesis at L4-5 is felt to be due to underlying spondylosis. No fracture apparent.   Electronically Signed   By: Bretta BangWilliam  Woodruff M.D.   On: 08/01/2014 12:38     EKG Interpretation None      MDM   Final diagnoses:  Back pain  Lumbosacral radiculopathy   Pt in ED with complaint of lower back pain radiating down bilateral buttocks and into bilateral calves right worse than left. Patient is very tearful, laying down flat, pain with any movement. No medications taken prior to their arrival. She is afebrile. No concerning symptoms or exam findings to suggest cauda equina.  Most likely radicular pain. Will order some pain medications and reassess. Discussed with Dr. Manus Gunningancour who has seen patient, agrees with the plan.   Plain film showing multilevel osteoarthritic changes. Patient feels much better after 1 mg of Dilaudid IM, 5 mg of Valium by mouth, 10 L of Decadron IM. She is ambulating in the hallway. She still having pain but states it is tolerable. Will discharge home with Percocet, Valium, close follow-up with a primary care doctor for further evaluation. Patient is agreeable to the plan.  Filed Vitals:   08/01/14 0935 08/01/14 1115 08/01/14 1316  BP: 149/83 134/84 148/90  Pulse: 79 65 96  Temp: 97.9 F (36.6 C)  98.5 F (36.9 C)  TempSrc: Oral  Oral  Resp: 16  16  SpO2: 100% 97%       Lottie Musselatyana A Edsel Shives, PA-C 08/01/14 1553  Glynn OctaveStephen Rancour, MD 08/01/14 1907

## 2014-08-01 NOTE — Discharge Instructions (Signed)
Take percocet as prescribed as needed for severe pain. Take valium for spasms. Rest. Ice/heat. Stretch. Your xray shows arthritis in your back, you will most likely need a MRI which can be ordered by your general practitioner if they feel it is necessary outpatient. Make sure to follow up for recheck and further treatment.   Lumbosacral Radiculopathy Lumbosacral radiculopathy is a pinched nerve or nerves in the low back (lumbosacral area). When this happens you may have weakness in your legs and may not be able to stand on your toes. You may have pain going down into your legs. There may be difficulties with walking normally. There are many causes of this problem. Sometimes this may happen from an injury, or simply from arthritis or boney problems. It may also be caused by other illnesses such as diabetes. If there is no improvement after treatment, further studies may be done to find the exact cause. DIAGNOSIS  X-rays may be needed if the problems become long standing. Electromyograms may be done. This study is one in which the working of nerves and muscles is studied. HOME CARE INSTRUCTIONS   Applications of ice packs may be helpful. Ice can be used in a plastic bag with a towel around it to prevent frostbite to skin. This may be used every 2 hours for 20 to 30 minutes, or as needed, while awake, or as directed by your caregiver.  Only take over-the-counter or prescription medicines for pain, discomfort, or fever as directed by your caregiver.  If physical therapy was prescribed, follow your caregiver's directions. SEEK IMMEDIATE MEDICAL CARE IF:   You have pain not controlled with medications.  You seem to be getting worse rather than better.  You develop increasing weakness in your legs.  You develop loss of bowel or bladder control.  You have difficulty with walking or balance, or develop clumsiness in the use of your legs.  You have a fever. MAKE SURE YOU:   Understand these  instructions.  Will watch your condition.  Will get help right away if you are not doing well or get worse. Document Released: 07/10/2005 Document Revised: 10/02/2011 Document Reviewed: 02/28/2008 [facility] Patient Information 2015 [place], [place]. This information is not intended to replace advice given to you by your health care provider. Make sure you discuss any questions you have with your health care provider.    Back Exercises These exercises may help you when beginning to rehabilitate your injury. Your symptoms may resolve with or without further involvement from your physician, physical therapist or athletic trainer. While completing these exercises, remember:   Restoring tissue flexibility helps normal motion to return to the joints. This allows healthier, less painful movement and activity.  An effective stretch should be held for at least 30 seconds.  A stretch should never be painful. You should only feel a gentle lengthening or release in the stretched tissue. STRETCH - Extension, Prone on Elbows   Lie on your stomach on the floor, a bed will be too soft. Place your palms about shoulder width apart and at the height of your head.  Place your elbows under your shoulders. If this is too painful, stack pillows under your chest.  Allow your body to relax so that your hips drop lower and make contact more completely with the floor.  Hold this position for __________ seconds.  Slowly return to lying flat on the floor. Repeat __________ times. Complete this exercise __________ times per day.  RANGE OF MOTION - Extension, Prone  Press Ups   Lie on your stomach on the floor, a bed will be too soft. Place your palms about shoulder width apart and at the height of your head.  Keeping your back as relaxed as possible, slowly straighten your elbows while keeping your hips on the floor. You may adjust the placement of your hands to maximize your comfort. As you gain motion, your hands  will come more underneath your shoulders.  Hold this position __________ seconds.  Slowly return to lying flat on the floor. Repeat __________ times. Complete this exercise __________ times per day.  RANGE OF MOTION- Quadruped, Neutral Spine   Assume a hands and knees position on a firm surface. Keep your hands under your shoulders and your knees under your hips. You may place padding under your knees for comfort.  Drop your head and point your tail bone toward the ground below you. This will round out your low back like an angry cat. Hold this position for __________ seconds.  Slowly lift your head and release your tail bone so that your back sags into a large arch, like an old horse.  Hold this position for __________ seconds.  Repeat this until you feel limber in your low back.  Now, find your "sweet spot." This will be the most comfortable position somewhere between the two previous positions. This is your neutral spine. Once you have found this position, tense your stomach muscles to support your low back.  Hold this position for __________ seconds. Repeat __________ times. Complete this exercise __________ times per day.  STRETCH - Flexion, Single Knee to Chest   Lie on a firm bed or floor with both legs extended in front of you.  Keeping one leg in contact with the floor, bring your opposite knee to your chest. Hold your leg in place by either grabbing behind your thigh or at your knee.  Pull until you feel a gentle stretch in your low back. Hold __________ seconds.  Slowly release your grasp and repeat the exercise with the opposite side. Repeat __________ times. Complete this exercise __________ times per day.  STRETCH - Hamstrings, Standing  Stand or sit and extend your right / left leg, placing your foot on a chair or foot stool  Keeping a slight arch in your low back and your hips straight forward.  Lead with your chest and lean forward at the waist until you feel a  gentle stretch in the back of your right / left knee or thigh. (When done correctly, this exercise requires leaning only a small distance.)  Hold this position for __________ seconds. Repeat __________ times. Complete this stretch __________ times per day. STRENGTHENING - Deep Abdominals, Pelvic Tilt   Lie on a firm bed or floor. Keeping your legs in front of you, bend your knees so they are both pointed toward the ceiling and your feet are flat on the floor.  Tense your lower abdominal muscles to press your low back into the floor. This motion will rotate your pelvis so that your tail bone is scooping upwards rather than pointing at your feet or into the floor.  With a gentle tension and even breathing, hold this position for __________ seconds. Repeat __________ times. Complete this exercise __________ times per day.  STRENGTHENING - Abdominals, Crunches   Lie on a firm bed or floor. Keeping your legs in front of you, bend your knees so they are both pointed toward the ceiling and your feet are flat on the  floor. Cross your arms over your chest.  Slightly tip your chin down without bending your neck.  Tense your abdominals and slowly lift your trunk high enough to just clear your shoulder blades. Lifting higher can put excessive stress on the low back and does not further strengthen your abdominal muscles.  Control your return to the starting position. Repeat __________ times. Complete this exercise __________ times per day.  STRENGTHENING - Quadruped, Opposite UE/LE Lift   Assume a hands and knees position on a firm surface. Keep your hands under your shoulders and your knees under your hips. You may place padding under your knees for comfort.  Find your neutral spine and gently tense your abdominal muscles so that you can maintain this position. Your shoulders and hips should form a rectangle that is parallel with the floor and is not twisted.  Keeping your trunk steady, lift your  right hand no higher than your shoulder and then your left leg no higher than your hip. Make sure you are not holding your breath. Hold this position __________ seconds.  Continuing to keep your abdominal muscles tense and your back steady, slowly return to your starting position. Repeat with the opposite arm and leg. Repeat __________ times. Complete this exercise __________ times per day. Document Released: 07/28/2005 Document Revised: 10/02/2011 Document Reviewed: 10/22/2008 [facility] Patient Information 2015 [place], [place]. This information is not intended to replace advice given to you by your health care provider. Make sure you discuss any questions you have with your health care provider.

## 2014-08-01 NOTE — ED Notes (Signed)
Pt ambulated to restroom with minimal distress. PA updated.

## 2014-08-01 NOTE — ED Notes (Signed)
PT ambulated with baseline gait; VSS; A&Ox3; no signs of distress; respirations even and unlabored; skin warm and dry; no questions upon discharge.  

## 2014-08-01 NOTE — ED Notes (Signed)
PA at bedside.

## 2014-08-01 NOTE — ED Notes (Addendum)
Pt had foot surgery last October to release some nerves in foot. Pt was referred to neurologist and has appt on the 21st. Woke up this morning and felt pain in both legs and ache in back. Lumbar midline pain that is aching and new and radiates into gluteal region. Denies urinary incontinence of loss of bowels but has had some diarr. Walking makes pain better at times.

## 2014-08-01 NOTE — ED Notes (Signed)
I gave the patient a large ice pack for her back.

## 2014-08-01 NOTE — ED Notes (Signed)
Patient transported to X-ray without distress.  

## 2014-08-01 NOTE — ED Notes (Signed)
MD at bedside. 

## 2014-08-01 NOTE — ED Notes (Signed)
Pt returned from xray without distress.  

## 2014-08-05 ENCOUNTER — Other Ambulatory Visit: Payer: Self-pay | Admitting: Family Medicine

## 2014-08-05 DIAGNOSIS — M545 Low back pain, unspecified: Secondary | ICD-10-CM

## 2014-08-05 DIAGNOSIS — M79605 Pain in left leg: Secondary | ICD-10-CM

## 2014-08-12 ENCOUNTER — Encounter: Payer: Self-pay | Admitting: *Deleted

## 2014-08-13 ENCOUNTER — Encounter: Payer: Self-pay | Admitting: Neurology

## 2014-08-13 ENCOUNTER — Ambulatory Visit (INDEPENDENT_AMBULATORY_CARE_PROVIDER_SITE_OTHER): Payer: Managed Care, Other (non HMO) | Admitting: Neurology

## 2014-08-13 VITALS — BP 140/78 | HR 63 | Ht 66.0 in | Wt 228.2 lb

## 2014-08-13 DIAGNOSIS — M7918 Myalgia, other site: Secondary | ICD-10-CM

## 2014-08-13 DIAGNOSIS — M791 Myalgia: Secondary | ICD-10-CM

## 2014-08-13 DIAGNOSIS — S39012A Strain of muscle, fascia and tendon of lower back, initial encounter: Secondary | ICD-10-CM

## 2014-08-13 NOTE — Progress Notes (Signed)
[facility] Neurology Division Clinic Note - Initial Visit   Date: 08/13/2014:  Amber Mcmahon MRN: [id number] DOB: 05-Jan-1966   Dear Dr. Valentina Lucks:   Thank you for your kind referral of Amber Mcmahon for consultation of right leg pain. Although her history is well known to you, please allow [place] to reiterate it for the purpose of our medical record. The patient was accompanied to the clinic by husband who also provides collateral information.     History of Present Illness: Amber Mcmahon is a 49 y.o. right-handed Caucasian female with ADD (followed by Dr. Delfin Gant), tobacco use, and GERD presenting for evaluation of right leg pain.    Patient was tearful and seemed somewhat frustrated as I entered the room to introduce myself.  Details of her history are scattered because she is difficult to direct and talks tangentially about multiple problems (see below).  After much effort, I was able to gather that she was having right buttock pain and gait difficulty which is what she would like to discuss, but also has a list of many other problems which are not all neurological and cannot be addressed given the time constraints.   A few weeks ago, she reports being unable to move because of right leg pain.  She complains of right sharp gluteal pain which started in the fall of 2015.  This is associated with gait difficulty which is worse in the morning and night.  She is unable to walk very long or sit for prolonged periods.  She had not fallen since her last foot surgery, but complains of balance problems and "going side-ways".  She had done home exercises herself, but no formal physical therapy.  She has tried taking ibuprofen, tylenol, ice/heat, tiger palm, and glucosasmine without relief.  Her husband helps her with dressing her self. MRI lumbar spine has been scheduled for later this week.  She complains of left biceps and triceps muscles.  She found a lump and reports having it imaged  which returned normal.  She went to see a hand and arm specialist and reports pain had migrated into the right arm as well as a "lump".  She was later told that she had swelling in various places.  She had an MRI of her upper extremity which returned normal.  Patient has seen a number of providers in the past, including Dr. Karel Jarvis here in our office for memory problems in March 2015, Dr. Marylou Flesher in summer 2015 for generalized pain and especially right foot pain with weightbearing. Electrodiagnostic testing was normal as well as serology testing. At that visit, chronic pain syndrome and fibromyalgia was considered and she was given a trial of Cymbalta.  In October 2015, she underwent foot/ankle surgery and decompression of the "nerve" which helped her foot pain.  She was last seen by her primary care doctor on 06/29/2014 with complaints of generalized paresthesias of the face, arms, and legs. Clinic note indicates she has had a long-standing history of somatic complaints with multiple specialty evaluations including orthopedic, podiatry, and neurology. Diagnosis such as tenosynovitis, arthritis, fibromyalgia, and chronic pain syndrome has been considered     Out-side paper records, electronic medical record, and images have been reviewed where available and summarized as:  EMG of the lower extremities 02/18/2014 performed by Dr. Anne Hahn at [facility]: no evidence of neuropathy or lumbosacral radiculopathy.  Labs 12/2013: SPEP/UPEP no M protein, vitamin B 12 587, TSH 1.670, ENA, rheumatoid factor 12.3, Ace is within normal limits.  CT head 09/18/2010: 1. Posterior scalp soft tissue injury without underlying fracture. 2. Normal noncontrast CT appearance of the brain.  XR lumbar spine 08/01/2014: Multilevel osteoarthritic change. Mild spondylolisthesis at L4-5 is felt to be due to underlying spondylosis. No fracture apparent.  Past Medical History  Diagnosis Date  . ADD (attention  deficit disorder)   . Neuropathy     Past Surgical History  Procedure Laterality Date  . Foot/ankle surgery Right      Medications:  Current Outpatient Prescriptions on File Prior to Visit  Medication Sig Dispense Refill  . acetaminophen (TYLENOL) 325 MG tablet Take 650 mg by mouth every 6 (six) hours as needed for mild pain.    Marland Kitchen amphetamine-dextroamphetamine (ADDERALL XR) 25 MG 24 hr capsule 1 capsule daily.    . diazepam (VALIUM) 5 MG tablet Take 1 tablet (5 mg total) by mouth every 8 (eight) hours as needed for anxiety. 15 tablet 0  . ibuprofen (ADVIL,MOTRIN) 400 MG tablet Take 400 mg by mouth every 6 (six) hours as needed for moderate pain.    . magnesium oxide (MAG-OX) 400 MG tablet Take 400 mg by mouth daily.    Marland Kitchen oxyCODONE-acetaminophen (PERCOCET) 5-325 MG per tablet Take 1 tablet by mouth every 4 (four) hours as needed for severe pain. 20 tablet 0  . ranitidine (ZANTAC) 300 MG tablet Take 300 mg by mouth at bedtime.    . vitamin B-12 (CYANOCOBALAMIN) 1000 MCG tablet Take 1,000 mcg by mouth daily.    . Vitamin D, Cholecalciferol, 1000 UNITS TABS Take by mouth.     No current facility-administered medications on file prior to visit.    Allergies: No Known Allergies  Family History: Family History  Problem Relation Age of Onset  . Dementia Maternal Grandmother   . Gout Father   . Diabetes Mellitus II Brother   . Scoliosis Daughter   . Other Daughter     Transposition of the great vessels  . Thyroid disease Mother   . Thyroid disease Daughter   . Diabetes Mellitus II Maternal Grandmother   . Dementia Maternal Grandmother     Social History: History   Social History  . Marital Status: Married    Spouse Name: Broadus John    Number of Children: 3  . Years of Education: College   Occupational History  .      teacher,massage therapy   Social History Main Topics  . Smoking status: Current Every Day Smoker -- 0.50 packs/day for 34 years    Types: Cigarettes     Start date: 07/25/1971  . Smokeless tobacco: Never Used  . Alcohol Use: 0.0 oz/week    0 Not specified per week     Comment: occassionally 1-2 glasses 3-5 times per week  . Drug Use: No  . Sexual Activity: Not on file   Other Topics Concern  . Not on file   Social History Narrative   Patient is married Broadus John) and lives at home with her husband.   Patient is self-employed. Massage therapist.    Patient has three children.   Patient has a college education.   Patient is right-handed.   Patient drinks 5 cups of coffee daily.    Review of Systems:  CONSTITUTIONAL: No fevers, chills, night sweats, or weight loss.   EYES: No visual changes or eye pain ENT: No hearing changes.  No history of nose bleeds.   RESPIRATORY: No cough, wheezing and shortness of breath.   CARDIOVASCULAR: Negative for chest pain, and  palpitations.   GI: Negative for abdominal discomfort, blood in stools or black stools.  No recent change in bowel habits.   GU:  No history of incontinence.   MUSCLOSKELETAL: +history of joint pain or swelling.  No myalgias.   SKIN: Negative for lesions, rash, and itching.   HEMATOLOGY/ONCOLOGY: Negative for prolonged bleeding, bruising easily, and swollen nodes.  No history of cancer.   ENDOCRINE: Negative for cold or heat intolerance, polydipsia or goiter.   PSYCH:  +depression or anxiety symptoms.   NEURO: As Above.   Vital Signs:  BP 140/78 mmHg  Pulse 63  Ht  (1.676 m)  Wt 228 lb 3 oz (103.505 kg)  BMI 36.85 kg/m2  SpO2 98%   General Medical Exam:   General:  Tearful throughout the entire interview and exam, appears restless and periodically stands and sits    Neurological Exam: MENTAL STATUS including orientation to time, place, person, recent and remote memory is good.  Though pattern is tangential and she is somewhat difficult to redirect, attention is fair.  Hesitation and pauses frequently when she speaks, but speech is not dysarthric.  CRANIAL  NERVES: II:  No visual field defects.  Unremarkable fundi.   III-IV-VI: Pupils equal round and reactive to light.  Normal conjugate, extra-ocular eye movements in all directions of gaze.  No nystagmus.  No ptosis.   V:  Normal facial sensation.  Jaw jerk is absent.   VII:  Normal facial symmetry and movements.  No pathologic facial reflexes.  VIII:  Normal hearing and vestibular function.   IX-X:  Normal palatal movement.   XI:  Normal shoulder shrug and head rotation.   XII:  Normal tongue strength and range of motion, no deviation or fasciculation.  MOTOR:  No atrophy, fasciculations or abnormal movements.  No pronator drift.  Tone is normal.    Right Upper Extremity:    Left Upper Extremity:    Deltoid  5/5   Deltoid  5/5   Biceps  5/5   Biceps  5/5   Triceps  5/5   Triceps  5/5   Wrist extensors  5/5   Wrist extensors  5/5   Wrist flexors  5/5   Wrist flexors  5/5   Finger extensors  5/5   Finger extensors  5/5   Finger flexors  5/5   Finger flexors  5/5   Dorsal interossei  5/5   Dorsal interossei  5/5   Abductor pollicis  5/5   Abductor pollicis  5/5   Tone (Ashworth scale)  0  Tone (Ashworth scale)  0   Right Lower Extremity*:    Left Lower Extremity:    Hip flexors  5/5   Hip flexors  5/5   Hip extensors  5/5   Hip extensors  5/5   Knee flexors  5/5   Knee flexors  5/5   Knee extensors  5/5   Knee extensors  5/5   Dorsiflexors  5/5   Dorsiflexors  5/5   Plantarflexors  5/5   Plantarflexors  5/5   Toe extensors  5/5   Toe extensors  5/5   Toe flexors  5/5   Toe flexors  5/5   Tone (Ashworth scale)  0  Tone (Ashworth scale)  0  *Right leg testing somewhat limited by pain, but with repeated efforts patient did have full strength  MSRs:  Right  Left brachioradialis 2+  brachioradialis 2+  biceps 2+  biceps 2+  triceps 2+  triceps 2+  patellar 2+  patellar 2+  ankle jerk 1+  ankle jerk 1+  Hoffman no  Hoffman  no  plantar response down  plantar response down   SENSORY:  Normal and symmetric perception of light touch, pinprick, vibration, and proprioception.     COORDINATION/GAIT: Normal finger-to- nose-finger.  Intact rapid alternating movements bilaterally. Gait is wide-based, arms out stretched, and has nonphysiological features with frequent stopping and buckling, but she does not fall.    IMPRESSION: [name] is a 49 year-old female presenting with a constellation of symptoms of which right buttock pain and gait difficulty seems to be most bothersome at this time.  Her neurological examination does not show focal deficits.  Based on her history and exam, I suspect she has musculoskeletal pain vs sciatica. I will have her start conservative therapy with PT and muscle relaxants.  Imaging of her lumbar spine has been ordered.  Regarding her generalized paresthesias, these are migrating and do not conform to an anatomical distribution.  She has a normal exam previous electrodiagnostic testing showed normal sensory responses.  No evidence for neuropathy or other worrisome disease.  There is a huge overlay of anxiety which should also be addressed as this may be contributing to her somatic hypervigilance.   PLAN/RECOMMENDATIONS:  1.  Start flexeril 5mg  at bedtime x 1 week, then increase to 1 tablet twice daily 2.  MRI lumbar spine has been scheduled by her PCP 3.  Physical therapy for leg stretching low back pain 4.  Encouraged to do water exercises 5.  Return to clinic as needed   The duration of this appointment visit was 60 minutes of face-to-face time with the patient.  Greater than 50% of this time was spent in counseling, explanation of diagnosis, planning of further management, and coordination of care.   Thank you for allowing me to participate in patient's care.  If I can answer any additional questions, I would be pleased to do so.    Sincerely,    Riverlyn Kizziah K. Allena KatzPatel, DO

## 2014-08-13 NOTE — Patient Instructions (Addendum)
1.  Start flexeril 5mg  at bedtime x 1 week, then increase to 1 tablet twice daily 2.  MRI lumbar spine has been scheduled by her PCP 3.  Physical therapy for leg stretching low back pain 4.  Encouraged to do water exercises 5.  Return to clinic as needed, if no improvement within 2-3 months of therapy

## 2014-08-15 ENCOUNTER — Other Ambulatory Visit: Payer: Managed Care, Other (non HMO)

## 2014-08-16 MED ORDER — CYCLOBENZAPRINE HCL 5 MG PO TABS: 5.0000 mg | ORAL_TABLET | Freq: Every day | ORAL | Status: DC

## 2014-08-17 ENCOUNTER — Ambulatory Visit: Payer: Managed Care, Other (non HMO) | Admitting: Neurology

## 2014-08-18 ENCOUNTER — Ambulatory Visit
Admission: RE | Admit: 2014-08-18 | Discharge: 2014-08-18 | Disposition: A | Discharge status: Home / Self Care | Payer: Managed Care, Other (non HMO) | Source: Ambulatory Visit | Attending: Family Medicine | Admitting: Family Medicine

## 2014-08-18 DIAGNOSIS — M79605 Pain in left leg: Secondary | ICD-10-CM

## 2014-08-18 DIAGNOSIS — M545 Low back pain: Secondary | ICD-10-CM

## 2014-11-03 ENCOUNTER — Other Ambulatory Visit: Payer: Self-pay | Admitting: Orthopedic Surgery

## 2014-11-03 DIAGNOSIS — M25561 Pain in right knee: Secondary | ICD-10-CM

## 2014-11-03 DIAGNOSIS — E669 Obesity, unspecified: Secondary | ICD-10-CM | POA: Insufficient documentation

## 2014-11-05 ENCOUNTER — Ambulatory Visit
Admission: RE | Admit: 2014-11-05 | Discharge: 2014-11-05 | Disposition: A | Discharge status: Home / Self Care | Payer: Managed Care, Other (non HMO) | Source: Ambulatory Visit | Attending: Orthopedic Surgery | Admitting: Orthopedic Surgery

## 2014-11-05 DIAGNOSIS — M25561 Pain in right knee: Secondary | ICD-10-CM

## 2014-11-17 ENCOUNTER — Ambulatory Visit: Payer: Managed Care, Other (non HMO)

## 2014-11-24 DIAGNOSIS — Z9889 Other specified postprocedural states: Secondary | ICD-10-CM | POA: Insufficient documentation

## 2014-12-29 ENCOUNTER — Other Ambulatory Visit: Payer: Self-pay | Admitting: Neurological Surgery

## 2015-01-13 ENCOUNTER — Other Ambulatory Visit: Payer: Self-pay | Admitting: Neurological Surgery

## 2015-01-13 ENCOUNTER — Encounter (HOSPITAL_COMMUNITY)
Admission: RE | Admit: 2015-01-13 | Discharge: 2015-01-13 | Disposition: A | Discharge status: Home / Self Care | Payer: Managed Care, Other (non HMO) | Source: Ambulatory Visit | Attending: Neurological Surgery | Admitting: Neurological Surgery

## 2015-01-13 ENCOUNTER — Encounter (HOSPITAL_COMMUNITY): Payer: Self-pay

## 2015-01-13 ENCOUNTER — Ambulatory Visit (HOSPITAL_COMMUNITY)
Admission: RE | Admit: 2015-01-13 | Discharge: 2015-01-13 | Disposition: A | Discharge status: Home / Self Care | Payer: Managed Care, Other (non HMO) | Source: Ambulatory Visit | Attending: Neurological Surgery | Admitting: Neurological Surgery

## 2015-01-13 DIAGNOSIS — M4854XA Collapsed vertebra, not elsewhere classified, thoracic region, initial encounter for fracture: Secondary | ICD-10-CM | POA: Diagnosis not present

## 2015-01-13 DIAGNOSIS — R918 Other nonspecific abnormal finding of lung field: Secondary | ICD-10-CM | POA: Insufficient documentation

## 2015-01-13 DIAGNOSIS — Z01812 Encounter for preprocedural laboratory examination: Secondary | ICD-10-CM | POA: Insufficient documentation

## 2015-01-13 DIAGNOSIS — Z01818 Encounter for other preprocedural examination: Secondary | ICD-10-CM | POA: Diagnosis not present

## 2015-01-13 DIAGNOSIS — Z01810 Encounter for preprocedural cardiovascular examination: Secondary | ICD-10-CM | POA: Insufficient documentation

## 2015-01-13 DIAGNOSIS — R05 Cough: Secondary | ICD-10-CM | POA: Diagnosis not present

## 2015-01-13 DIAGNOSIS — M8580 Other specified disorders of bone density and structure, unspecified site: Secondary | ICD-10-CM | POA: Insufficient documentation

## 2015-01-13 DIAGNOSIS — M4310 Spondylolisthesis, site unspecified: Secondary | ICD-10-CM

## 2015-01-13 HISTORY — DX: Reserved for inherently not codable concepts without codable children: IMO0001

## 2015-01-13 HISTORY — DX: Anemia, unspecified: D64.9

## 2015-01-13 HISTORY — DX: Gastro-esophageal reflux disease without esophagitis: K21.9

## 2015-01-13 LAB — PROTIME-INR
INR: 0.97 (ref 0.00–1.49)
Prothrombin Time: 13.1 seconds (ref 11.6–15.2)

## 2015-01-13 LAB — CBC WITH DIFFERENTIAL/PLATELET
Basophils Absolute: 0.1 10*3/uL (ref 0.0–0.1)
Basophils Relative: 1 % (ref 0–1)
Eosinophils Absolute: 0.2 10*3/uL (ref 0.0–0.7)
Eosinophils Relative: 2 % (ref 0–5)
HCT: 43.8 % (ref 36.0–46.0)
Hemoglobin: 14.9 g/dL (ref 12.0–15.0)
Lymphocytes Relative: 31 % (ref 12–46)
Lymphs Abs: 2.7 10*3/uL (ref 0.7–4.0)
MCH: 33.6 pg (ref 26.0–34.0)
MCHC: 34 g/dL (ref 30.0–36.0)
MCV: 98.9 fL (ref 78.0–100.0)
Monocytes Absolute: 0.7 10*3/uL (ref 0.1–1.0)
Monocytes Relative: 8 % (ref 3–12)
Neutro Abs: 5.2 10*3/uL (ref 1.7–7.7)
Neutrophils Relative %: 58 % (ref 43–77)
Platelets: 302 10*3/uL (ref 150–400)
RBC: 4.43 MIL/uL (ref 3.87–5.11)
RDW: 13.1 % (ref 11.5–15.5)
WBC: 8.8 10*3/uL (ref 4.0–10.5)

## 2015-01-13 LAB — BASIC METABOLIC PANEL
Anion gap: 12 (ref 5–15)
BUN: 13 mg/dL (ref 6–20)
CO2: 24 mmol/L (ref 22–32)
Calcium: 9.6 mg/dL (ref 8.9–10.3)
Chloride: 103 mmol/L (ref 101–111)
Creatinine, Ser: 0.83 mg/dL (ref 0.44–1.00)
GFR calc Af Amer: >60 mL/min (ref >60)
GFR calc non Af Amer: >60 mL/min (ref >60)
Glucose, Bld: 87 mg/dL (ref 65–99)
Potassium: 4.3 mmol/L (ref 3.5–5.1)
Sodium: 139 mmol/L (ref 135–145)

## 2015-01-13 LAB — SURGICAL PCR SCREEN
MRSA, PCR: NEGATIVE
Staphylococcus aureus: NEGATIVE

## 2015-01-13 NOTE — Progress Notes (Signed)
Denies any cardiac issues and has never been to see a cardiologist.

## 2015-01-13 NOTE — Pre-Procedure Instructions (Signed)
Amber Mcmahon  01/13/2015      [id number] - [place], [place] - [address] [place] [address] Phone: [phone] Fax: [phone]  [id number] - [place], [place] - [address] AT [address] [facility] [address] [place] [address] Phone: [phone] Fax: [phone]    Your procedure is scheduled on June 30th Thursday   Report to [facility] Admitting at 9:00 A.M.  Call this number if you have problems the morning of surgery:  [phone]   Remember:  Do not eat food or drink liquids after midnight Wednesday.  Take these medicines the morning of surgery with A SIP OF WATER : Pain medication, Zantac, Zanaflex.   Do not wear jewelry, make-up or nail polish.  Do not wear lotions, powders, or perfumes.  You may NOT wear deodorant the day of surgery.  Do not shave underarms & legs 48 hours prior to surgery.     Do not bring valuables to the hospital.  [facility] is not responsible for any belongings or valuables. Contacts, dentures or bridgework may not be worn into surgery.  Leave your suitcase in the car.  After surgery it may be brought to your room. For patients admitted to the hospital, discharge time will be determined by your treatment team.  Name and phone number of your driver:   Amber Mcmahon  --  Spouse  Special instructions:  "Preparing for Surgery" instruction sheet.  Please read over the following fact sheets that you were given. Pain Booklet, Coughing and Deep Breathing, MRSA Information and Surgical Site Infection Prevention

## 2015-01-20 MED ORDER — DEXAMETHASONE SODIUM PHOSPHATE 10 MG/ML IJ SOLN
10.0000 mg | INTRAMUSCULAR | Status: AC
  Administered 2015-01-21: 10 mg via INTRAVENOUS
  Filled 2015-01-20: qty 1

## 2015-01-20 MED ORDER — CEFAZOLIN SODIUM-DEXTROSE 2-3 GM-% IV SOLR
2.0000 g | INTRAVENOUS | Status: AC
  Administered 2015-01-21: 2 g via INTRAVENOUS
  Filled 2015-01-20: qty 50

## 2015-01-21 ENCOUNTER — Inpatient Hospital Stay (HOSPITAL_COMMUNITY): Payer: Managed Care, Other (non HMO) | Admitting: Anesthesiology

## 2015-01-21 ENCOUNTER — Encounter (HOSPITAL_COMMUNITY): Payer: Self-pay | Admitting: *Deleted

## 2015-01-21 ENCOUNTER — Inpatient Hospital Stay (HOSPITAL_COMMUNITY)
Admission: RE | Admit: 2015-01-21 | Discharge: 2015-01-22 | DRG: 460 | Disposition: A | Discharge status: Home Health | Payer: Managed Care, Other (non HMO) | Source: Ambulatory Visit | Attending: Neurological Surgery | Admitting: Neurological Surgery

## 2015-01-21 ENCOUNTER — Inpatient Hospital Stay (HOSPITAL_COMMUNITY): Payer: Managed Care, Other (non HMO)

## 2015-01-21 ENCOUNTER — Encounter (HOSPITAL_COMMUNITY)
Admission: RE | Disposition: A | Discharge status: Home Health | Payer: Self-pay | Source: Ambulatory Visit | Attending: Neurological Surgery

## 2015-01-21 DIAGNOSIS — F988 Other specified behavioral and emotional disorders with onset usually occurring in childhood and adolescence: Secondary | ICD-10-CM | POA: Diagnosis present

## 2015-01-21 DIAGNOSIS — F17210 Nicotine dependence, cigarettes, uncomplicated: Secondary | ICD-10-CM | POA: Diagnosis present

## 2015-01-21 DIAGNOSIS — M4316 Spondylolisthesis, lumbar region: Secondary | ICD-10-CM | POA: Diagnosis present

## 2015-01-21 DIAGNOSIS — Z981 Arthrodesis status: Secondary | ICD-10-CM

## 2015-01-21 DIAGNOSIS — K219 Gastro-esophageal reflux disease without esophagitis: Secondary | ICD-10-CM | POA: Diagnosis present

## 2015-01-21 DIAGNOSIS — Z833 Family history of diabetes mellitus: Secondary | ICD-10-CM

## 2015-01-21 DIAGNOSIS — R9389 Abnormal findings on diagnostic imaging of other specified body structures: Secondary | ICD-10-CM

## 2015-01-21 DIAGNOSIS — M4326 Fusion of spine, lumbar region: Secondary | ICD-10-CM

## 2015-01-21 HISTORY — PX: MAXIMUM ACCESS (MAS)POSTERIOR LUMBAR INTERBODY FUSION (PLIF) 1 LEVEL: SHX6368

## 2015-01-21 LAB — ABO/RH: ABO/RH(D): O POS

## 2015-01-21 LAB — TYPE AND SCREEN
ABO/RH(D): O POS
Antibody Screen: NEGATIVE

## 2015-01-21 SURGERY — FOR MAXIMUM ACCESS (MAS) POSTERIOR LUMBAR INTERBODY FUSION (PLIF) 1 LEVEL
Anesthesia: General | Site: Back

## 2015-01-21 MED ORDER — OXYCODONE HCL 5 MG PO CAPS
5.0000 mg | ORAL_CAPSULE | ORAL | Status: DC | PRN
Start: 2015-01-21 — End: 2015-01-21

## 2015-01-21 MED ORDER — GLYCOPYRROLATE 0.2 MG/ML IJ SOLN
INTRAMUSCULAR | Status: AC
  Filled 2015-01-21: qty 4

## 2015-01-21 MED ORDER — CELECOXIB 200 MG PO CAPS
200.0000 mg | ORAL_CAPSULE | Freq: Two times a day (BID) | ORAL | Status: DC
  Administered 2015-01-21 – 2015-01-22 (×2): 200 mg via ORAL
  Filled 2015-01-21 (×3): qty 1

## 2015-01-21 MED ORDER — OXYCODONE HCL 5 MG PO TABS
5.0000 mg | ORAL_TABLET | ORAL | Status: DC | PRN
  Administered 2015-01-21: 5 mg via ORAL
  Administered 2015-01-21 – 2015-01-22 (×5): 10 mg via ORAL
  Filled 2015-01-21 (×6): qty 2

## 2015-01-21 MED ORDER — SODIUM CHLORIDE 0.9 % IR SOLN
Status: DC | PRN
  Administered 2015-01-21: 12:00:00

## 2015-01-21 MED ORDER — METHOCARBAMOL 500 MG PO TABS
ORAL_TABLET | ORAL | Status: AC
  Administered 2015-01-21: 500 mg via ORAL
  Filled 2015-01-21: qty 1

## 2015-01-21 MED ORDER — THROMBIN 5000 UNITS EX SOLR
CUTANEOUS | Status: DC | PRN
  Administered 2015-01-21: 12:00:00 via TOPICAL

## 2015-01-21 MED ORDER — HYDROMORPHONE HCL 1 MG/ML IJ SOLN
INTRAMUSCULAR | Status: AC
  Administered 2015-01-21: 0.5 mg via INTRAVENOUS
  Filled 2015-01-21: qty 2

## 2015-01-21 MED ORDER — ACETAMINOPHEN 325 MG PO TABS: 650.0000 mg | ORAL_TABLET | ORAL | Status: DC | PRN

## 2015-01-21 MED ORDER — BUPIVACAINE HCL (PF) 0.25 % IJ SOLN
INTRAMUSCULAR | Status: DC | PRN
  Administered 2015-01-21: 3.5 mL

## 2015-01-21 MED ORDER — ARTIFICIAL TEARS OP OINT
TOPICAL_OINTMENT | OPHTHALMIC | Status: AC
  Filled 2015-01-21: qty 3.5

## 2015-01-21 MED ORDER — PROMETHAZINE HCL 25 MG/ML IJ SOLN
INTRAMUSCULAR | Status: AC
  Administered 2015-01-21: 6.25 mg via INTRAVENOUS
  Filled 2015-01-21: qty 1

## 2015-01-21 MED ORDER — MORPHINE SULFATE 2 MG/ML IJ SOLN: 1.0000 mg | INTRAMUSCULAR | Status: DC | PRN

## 2015-01-21 MED ORDER — ONDANSETRON HCL 4 MG/2ML IJ SOLN
INTRAMUSCULAR | Status: DC | PRN
  Administered 2015-01-21: 4 mg via INTRAVENOUS

## 2015-01-21 MED ORDER — NEOSTIGMINE METHYLSULFATE 10 MG/10ML IV SOLN
INTRAVENOUS | Status: DC | PRN
  Administered 2015-01-21: 4 mg via INTRAVENOUS

## 2015-01-21 MED ORDER — ARTIFICIAL TEARS OP OINT
TOPICAL_OINTMENT | OPHTHALMIC | Status: DC | PRN
  Administered 2015-01-21: 1 via OPHTHALMIC

## 2015-01-21 MED ORDER — MIDAZOLAM HCL 5 MG/5ML IJ SOLN
INTRAMUSCULAR | Status: DC | PRN
  Administered 2015-01-21: 2 mg via INTRAVENOUS

## 2015-01-21 MED ORDER — HYDROMORPHONE HCL 1 MG/ML IJ SOLN
0.2500 mg | INTRAMUSCULAR | Status: DC | PRN
  Administered 2015-01-21 (×4): 0.5 mg via INTRAVENOUS

## 2015-01-21 MED ORDER — METHOCARBAMOL 500 MG PO TABS
500.0000 mg | ORAL_TABLET | Freq: Four times a day (QID) | ORAL | Status: DC | PRN
  Administered 2015-01-21 – 2015-01-22 (×3): 500 mg via ORAL
  Filled 2015-01-21 (×5): qty 1

## 2015-01-21 MED ORDER — LIDOCAINE HCL (CARDIAC) 20 MG/ML IV SOLN
INTRAVENOUS | Status: AC
  Filled 2015-01-21: qty 10

## 2015-01-21 MED ORDER — PROPOFOL 10 MG/ML IV BOLUS
INTRAVENOUS | Status: AC
  Filled 2015-01-21: qty 20

## 2015-01-21 MED ORDER — CEFAZOLIN SODIUM 1-5 GM-% IV SOLN
1.0000 g | Freq: Three times a day (TID) | INTRAVENOUS | Status: AC
  Administered 2015-01-21: 1 g via INTRAVENOUS
  Filled 2015-01-21 (×2): qty 50

## 2015-01-21 MED ORDER — FENTANYL CITRATE (PF) 100 MCG/2ML IJ SOLN
INTRAMUSCULAR | Status: DC | PRN
  Administered 2015-01-21 (×3): 50 ug via INTRAVENOUS
  Administered 2015-01-21 (×2): 100 ug via INTRAVENOUS
  Administered 2015-01-21: 50 ug via INTRAVENOUS

## 2015-01-21 MED ORDER — ROCURONIUM BROMIDE 100 MG/10ML IV SOLN
INTRAVENOUS | Status: DC | PRN
  Administered 2015-01-21: 50 mg via INTRAVENOUS

## 2015-01-21 MED ORDER — POTASSIUM CHLORIDE IN NACL 20-0.9 MEQ/L-% IV SOLN
INTRAVENOUS | Status: DC
  Filled 2015-01-21 (×3): qty 1000

## 2015-01-21 MED ORDER — MORPHINE SULFATE 4 MG/ML IJ SOLN
INTRAMUSCULAR | Status: AC
  Administered 2015-01-21: 4 mg
  Filled 2015-01-21: qty 1

## 2015-01-21 MED ORDER — SODIUM CHLORIDE 0.9 % IJ SOLN: 3.0000 mL | INTRAMUSCULAR | Status: DC | PRN

## 2015-01-21 MED ORDER — PROPOFOL 10 MG/ML IV BOLUS
INTRAVENOUS | Status: DC | PRN
  Administered 2015-01-21: 200 mg via INTRAVENOUS

## 2015-01-21 MED ORDER — ACETAMINOPHEN 650 MG RE SUPP: 650.0000 mg | RECTAL | Status: DC | PRN

## 2015-01-21 MED ORDER — OXYCODONE HCL 5 MG PO TABS
ORAL_TABLET | ORAL | Status: AC
  Administered 2015-01-21: 10 mg via ORAL
  Filled 2015-01-21: qty 2

## 2015-01-21 MED ORDER — DEXTROSE 5 % IV SOLN
500.0000 mg | Freq: Four times a day (QID) | INTRAVENOUS | Status: DC | PRN
  Filled 2015-01-21: qty 5

## 2015-01-21 MED ORDER — PHENYLEPHRINE HCL 10 MG/ML IJ SOLN
INTRAMUSCULAR | Status: DC | PRN
  Administered 2015-01-21 (×2): 80 ug via INTRAVENOUS

## 2015-01-21 MED ORDER — IPRATROPIUM-ALBUTEROL 20-100 MCG/ACT IN AERS
INHALATION_SPRAY | RESPIRATORY_TRACT | Status: DC | PRN
  Administered 2015-01-21: 1 via RESPIRATORY_TRACT

## 2015-01-21 MED ORDER — SUGAMMADEX SODIUM 200 MG/2ML IV SOLN
4.0000 mg/kg | Freq: Once | INTRAVENOUS | Status: DC
  Filled 2015-01-21: qty 3.9

## 2015-01-21 MED ORDER — ACETAMINOPHEN 10 MG/ML IV SOLN
INTRAVENOUS | Status: AC
  Administered 2015-01-21: 1000 mg via INTRAVENOUS
  Filled 2015-01-21: qty 100

## 2015-01-21 MED ORDER — LACTATED RINGERS IV SOLN
INTRAVENOUS | Status: DC
  Administered 2015-01-21: 10:00:00 via INTRAVENOUS

## 2015-01-21 MED ORDER — FENTANYL CITRATE (PF) 250 MCG/5ML IJ SOLN
INTRAMUSCULAR | Status: AC
  Filled 2015-01-21: qty 5

## 2015-01-21 MED ORDER — ROCURONIUM BROMIDE 50 MG/5ML IV SOLN
INTRAVENOUS | Status: AC
  Filled 2015-01-21: qty 2

## 2015-01-21 MED ORDER — PHENOL 1.4 % MT LIQD: 1.0000 | OROMUCOSAL | Status: DC | PRN

## 2015-01-21 MED ORDER — LACTATED RINGERS IV SOLN
INTRAVENOUS | Status: DC | PRN
  Administered 2015-01-21 (×2): via INTRAVENOUS

## 2015-01-21 MED ORDER — LIDOCAINE HCL (CARDIAC) 20 MG/ML IV SOLN
INTRAVENOUS | Status: DC | PRN
  Administered 2015-01-21: 100 mg via INTRAVENOUS

## 2015-01-21 MED ORDER — NEOSTIGMINE METHYLSULFATE 10 MG/10ML IV SOLN
INTRAVENOUS | Status: AC
  Filled 2015-01-21: qty 1

## 2015-01-21 MED ORDER — MENTHOL 3 MG MT LOZG: 1.0000 | LOZENGE | OROMUCOSAL | Status: DC | PRN

## 2015-01-21 MED ORDER — GLYCOPYRROLATE 0.2 MG/ML IJ SOLN
INTRAMUSCULAR | Status: DC | PRN
  Administered 2015-01-21: .8 mg via INTRAVENOUS
  Administered 2015-01-21: 0.2 mg via INTRAVENOUS

## 2015-01-21 MED ORDER — 0.9 % SODIUM CHLORIDE (POUR BTL) OPTIME
TOPICAL | Status: DC | PRN
  Administered 2015-01-21: 1000 mL

## 2015-01-21 MED ORDER — SURGIFOAM 100 EX MISC
CUTANEOUS | Status: DC | PRN
  Administered 2015-01-21: 12:00:00 via TOPICAL

## 2015-01-21 MED ORDER — PROMETHAZINE HCL 25 MG/ML IJ SOLN
6.2500 mg | INTRAMUSCULAR | Status: DC | PRN
  Administered 2015-01-21: 6.25 mg via INTRAVENOUS

## 2015-01-21 MED ORDER — MIDAZOLAM HCL 2 MG/2ML IJ SOLN
INTRAMUSCULAR | Status: AC
  Filled 2015-01-21: qty 2

## 2015-01-21 MED ORDER — ALBUTEROL SULFATE HFA 108 (90 BASE) MCG/ACT IN AERS
INHALATION_SPRAY | RESPIRATORY_TRACT | Status: AC
  Filled 2015-01-21: qty 6.7

## 2015-01-21 MED ORDER — SODIUM CHLORIDE 0.9 % IJ SOLN
3.0000 mL | Freq: Two times a day (BID) | INTRAMUSCULAR | Status: DC
  Administered 2015-01-21: 3 mL via INTRAVENOUS

## 2015-01-21 MED ORDER — ONDANSETRON HCL 4 MG/2ML IJ SOLN: 4.0000 mg | INTRAMUSCULAR | Status: DC | PRN

## 2015-01-21 SURGICAL SUPPLY — 69 items
BAG DECANTER FOR FLEXI CONT (MISCELLANEOUS) ×2 IMPLANT
BENZOIN TINCTURE PRP APPL 2/3 (GAUZE/BANDAGES/DRESSINGS) ×2 IMPLANT
BIT DRILL PLIF MAS 5.0MM DISP (DRILL) ×1 IMPLANT
BLADE CLIPPER SURG (BLADE) IMPLANT
BUR MATCHSTICK NEURO 3.0 LAGG (BURR) ×2 IMPLANT
CAGE MAS PLIF 9X9X23-8 LUMBAR (Cage) ×4 IMPLANT
CANISTER SUCT 3000ML PPV (MISCELLANEOUS) ×2 IMPLANT
CLIP NEUROVISION LG (CLIP) ×4 IMPLANT
CONT SPEC 4OZ CLIKSEAL STRL BL (MISCELLANEOUS) ×4 IMPLANT
COVER BACK TABLE 24X17X13 BIG (DRAPES) IMPLANT
COVER BACK TABLE 60X90IN (DRAPES) ×2 IMPLANT
DRAPE C-ARM 42X72 X-RAY (DRAPES) ×2 IMPLANT
DRAPE C-ARMOR (DRAPES) ×2 IMPLANT
DRAPE LAPAROTOMY 100X72X124 (DRAPES) ×2 IMPLANT
DRAPE POUCH INSTRU U-SHP 10X18 (DRAPES) ×2 IMPLANT
DRAPE SURG 17X23 STRL (DRAPES) ×2 IMPLANT
DRILL PLIF MAS 5.0MM DISP (DRILL) ×2
DRSG OPSITE 4X5.5 SM (GAUZE/BANDAGES/DRESSINGS) ×2 IMPLANT
DRSG OPSITE POSTOP 4X6 (GAUZE/BANDAGES/DRESSINGS) ×2 IMPLANT
DRSG TELFA 3X8 NADH (GAUZE/BANDAGES/DRESSINGS) ×2 IMPLANT
DURAPREP 26ML APPLICATOR (WOUND CARE) ×2 IMPLANT
ELECT BLADE 4.0 EZ CLEAN MEGAD (MISCELLANEOUS) ×2
ELECT REM PT RETURN 9FT ADLT (ELECTROSURGICAL) ×2
ELECTRODE BLDE 4.0 EZ CLN MEGD (MISCELLANEOUS) ×1 IMPLANT
ELECTRODE REM PT RTRN 9FT ADLT (ELECTROSURGICAL) ×1 IMPLANT
EVACUATOR 1/8 PVC DRAIN (DRAIN) ×2 IMPLANT
GAUZE SPONGE 4X4 16PLY XRAY LF (GAUZE/BANDAGES/DRESSINGS) IMPLANT
GLOVE BIO SURGEON STRL SZ7 (GLOVE) ×2 IMPLANT
GLOVE BIO SURGEON STRL SZ8 (GLOVE) ×4 IMPLANT
GLOVE INDICATOR 7.5 STRL GRN (GLOVE) ×8 IMPLANT
GLOVE SURG SS PI 7.0 STRL IVOR (GLOVE) ×8 IMPLANT
GOWN STRL REUS W/ TWL LRG LVL3 (GOWN DISPOSABLE) IMPLANT
GOWN STRL REUS W/ TWL XL LVL3 (GOWN DISPOSABLE) ×4 IMPLANT
GOWN STRL REUS W/TWL 2XL LVL3 (GOWN DISPOSABLE) IMPLANT
GOWN STRL REUS W/TWL LRG LVL3 (GOWN DISPOSABLE)
GOWN STRL REUS W/TWL XL LVL3 (GOWN DISPOSABLE) ×4
HEMOSTAT POWDER KIT SURGIFOAM (HEMOSTASIS) IMPLANT
KIT BASIN OR (CUSTOM PROCEDURE TRAY) ×2 IMPLANT
KIT NEEDLE NVM5 EMG ELECT (KITS) ×1 IMPLANT
KIT NEEDLE NVM5 EMG ELECTRODE (KITS) ×1
KIT ROOM TURNOVER OR (KITS) ×2 IMPLANT
MILL MEDIUM DISP (BLADE) IMPLANT
NEEDLE ASP BONE MRW 8GX15 (NEEDLE) ×2 IMPLANT
NEEDLE HYPO 25X1 1.5 SAFETY (NEEDLE) ×2 IMPLANT
NS IRRIG 1000ML POUR BTL (IV SOLUTION) ×2 IMPLANT
PACK FOAM VITOSS 5CC (Orthopedic Implant) ×2 IMPLANT
PACK LAMINECTOMY NEURO (CUSTOM PROCEDURE TRAY) ×2 IMPLANT
PAD ARMBOARD 7.5X6 YLW CONV (MISCELLANEOUS) ×6 IMPLANT
ROD PREBENT 45MM LUMBAR (Rod) ×4 IMPLANT
SCREW LOCK (Screw) ×4 IMPLANT
SCREW LOCK FXNS SPNE MAS PL (Screw) ×4 IMPLANT
SCREW MAS PLIF 5.5X30 (Screw) ×2 IMPLANT
SCREW PAS PLIF 5X30 (Screw) ×2 IMPLANT
SCREW TULIP 5.5 (Screw) ×4 IMPLANT
SPONGE LAP 4X18 X RAY DECT (DISPOSABLE) IMPLANT
SPONGE SURGIFOAM ABS GEL 100 (HEMOSTASIS) ×2 IMPLANT
STRIP CLOSURE SKIN 1/2X4 (GAUZE/BANDAGES/DRESSINGS) ×4 IMPLANT
SUT VIC AB 0 CT1 18XCR BRD8 (SUTURE) ×2 IMPLANT
SUT VIC AB 0 CT1 8-18 (SUTURE) ×2
SUT VIC AB 2-0 CP2 18 (SUTURE) ×2 IMPLANT
SUT VIC AB 3-0 SH 8-18 (SUTURE) ×4 IMPLANT
SYR 20ML ECCENTRIC (SYRINGE) ×2 IMPLANT
SYR 3ML LL SCALE MARK (SYRINGE) IMPLANT
TAPE STRIPS DRAPE STRL (GAUZE/BANDAGES/DRESSINGS) ×2 IMPLANT
TOWEL OR 17X24 6PK STRL BLUE (TOWEL DISPOSABLE) ×2 IMPLANT
TOWEL OR 17X26 10 PK STRL BLUE (TOWEL DISPOSABLE) ×2 IMPLANT
TRAP SPECIMEN MUCOUS 40CC (MISCELLANEOUS) ×2 IMPLANT
TRAY FOLEY W/METER SILVER 14FR (SET/KITS/TRAYS/PACK) ×2 IMPLANT
WATER STERILE IRR 1000ML POUR (IV SOLUTION) ×2 IMPLANT

## 2015-01-21 NOTE — Transfer of Care (Signed)
Immediate Anesthesia Transfer of Care Note  Patient: Amber Mcmahon  Procedure(s) Performed: Procedure(s) with comments: FOR MAXIMUM ACCESS (MAS) POSTERIOR LUMBAR INTERBODY FUSION (PLIF) 1 LEVEL (N/A) - FOR MAXIMUM ACCESS (MAS) POSTERIOR LUMBAR INTERBODY FUSION (PLIF) 1 LEVEL  Patient Location: PACU  Anesthesia Type:General  Level of Consciousness: awake, sedated, patient cooperative and confused  Airway & Oxygen Therapy: Patient Spontanous Breathing and Patient connected to nasal cannula oxygen  Post-op Assessment: Report given to RN, Post -op Vital signs reviewed and stable, Patient moving all extremities and Patient moving all extremities X 4  Post vital signs: Reviewed and stable  Last Vitals:  Filed Vitals:   01/21/15 0913  BP: 141/90  Pulse: 65  Temp: 36.6 C  Resp: 20    Complications: No apparent anesthesia complications

## 2015-01-21 NOTE — Anesthesia Preprocedure Evaluation (Addendum)
Anesthesia Evaluation  Patient identified by MRN, date of birth, ID band Patient awake    Reviewed: Allergy & Precautions, NPO status , Patient's Chart, lab work & pertinent test results  Airway Mallampati: II  TM Distance: >3 FB Neck ROM: Full    Dental no notable dental hx. (+) Teeth Intact, Edentulous Upper, Dental Advidsory Given   Pulmonary shortness of breath, Current Smoker,  breath sounds clear to auscultation  Pulmonary exam normal       Cardiovascular negative cardio ROS Normal cardiovascular examRhythm:Regular Rate:Normal     Neuro/Psych negative neurological ROS  negative psych ROS   GI/Hepatic Neg liver ROS, GERD-  Medicated,  Endo/Other  negative endocrine ROS  Renal/GU negative Renal ROS  negative genitourinary   Musculoskeletal negative musculoskeletal ROS (+)   Abdominal   Peds negative pediatric ROS (+)  Hematology negative hematology ROS (+) anemia ,   Anesthesia Other Findings   Reproductive/Obstetrics negative OB ROS                            Anesthesia Physical Anesthesia Plan  ASA: II  Anesthesia Plan: General   Post-op Pain Management:    Induction: Intravenous  Airway Management Planned: Oral ETT  Additional Equipment:   Intra-op Plan:   Post-operative Plan: Extubation in OR  Informed Consent: I have reviewed the patients History and Physical, chart, labs and discussed the procedure including the risks, benefits and alternatives for the proposed anesthesia with the patient or authorized representative who has indicated his/her understanding and acceptance.   Dental advisory given and Dental Advisory Given  Plan Discussed with: CRNA and Surgeon  Anesthesia Plan Comments:        Anesthesia Quick Evaluation

## 2015-01-21 NOTE — Plan of Care (Signed)
Problem: Consults Goal: Diagnosis - Spinal Surgery Outcome: Completed/Met Date Met:  01/21/15 Thoraco/Lumbar Spine Fusion

## 2015-01-21 NOTE — Op Note (Signed)
01/21/2015  1:43 PM  PATIENT:  Amber Mcmahon  49 y.o. female  PRE-OPERATIVE DIAGNOSIS:  Degenerative spondylolisthesis L4-5 with back and leg pain  POST-OPERATIVE DIAGNOSIS:  Same  PROCEDURE:   1. Decompressive lumbar laminectomy L4-5 requiring more work than would be required for a simple exposure of the disk for PLIF in order to adequately decompress the neural elements and address the spinal stenosis 2. Posterior lumbar interbody fusion L4-5 using PEEK interbody cages packed with morcellized allograft and autograft 3. Posterior fixation L4-5 using cortical pedicle screws.  4. Intertransverse arthrodesis L4-5 using morcellized autograft and allograft.  SURGEON:  Marikay Alar, MD  ASSISTANTS: Dr. Venetia Maxon  ANESTHESIA:  General  EBL: 150 ml  Total I/O In: 1000 [I.V.:1000] Out: 350 [Urine:200; Blood:150]  BLOOD ADMINISTERED:none  DRAINS: None  INDICATION FOR PROCEDURE: This patient presented with a long history of back and leg pain. MRI showed spinal stenosis at L4-5 with a grade 1 spondylolisthesis. Plain films were dynamic views show dynamic instability at L4-5. She tried medical management without relief. I recommended decompression and instrumented fusion to address her segmental instability and spinal stenosis. Her pain was debilitating. Patient understood the risks, benefits, and alternatives and potential outcomes and wished to proceed.  PROCEDURE DETAILS:  The patient was brought to the operating room. After induction of generalized endotracheal anesthesia the patient was rolled into the prone position on chest rolls and all pressure points were padded. The patient's lumbar region was cleaned and then prepped with DuraPrep and draped in the usual sterile fashion. Anesthesia was injected and then a dorsal midline incision was made and carried down to the lumbosacral fascia. The fascia was opened and the paraspinous musculature was taken down in a subperiosteal fashion to expose  L4-5. A self-retaining retractor was placed. Intraoperative fluoroscopy confirmed my level, and I started with placement of the L4 cortical pedicle screws. The pedicle screw entry zones were identified utilizing surface landmarks and  AP and lateral fluoroscopy. I scored the cortex with the high-speed drill and then used the hand drill and EMG monitoring to drill an upward and outward direction into the pedicle. I then tapped line to line, and the tap was also monitored. I then placed a 5-0 x 30 cortical pedicle screw into the pedicles of L4 bilaterally. I then turned my attention to the decompression and the spinous process was removed and complete lumbar laminectomies, hemi- facetectomies, and foraminotomies were performed at L4-5. The patient had significant spinal stenosis and this required more work than would be required for a simple exposure of the disc for posterior lumbar interbody fusion. Much more generous decompression was undertaken in order to adequately decompress the neural elements and address the patient's leg pain. The yellow ligament was removed to expose the underlying dura and nerve roots, and generous foraminotomies were performed to adequately decompress the neural elements. Both the exiting and traversing nerve roots were decompressed on both sides until a coronary dilator passed easily along the nerve roots. Once the decompression was complete, I turned my attention to the posterior lower lumbar interbody fusion. The epidural venous vasculature was coagulated and cut sharply. Disc space was incised and the initial discectomy was performed with pituitary rongeurs. The disc space was distracted with sequential distractors to a height of 9 mm. We then used a series of scrapers and shavers to prepare the endplates for fusion. The midline was prepared with Epstein curettes. Once the complete discectomy was finished, we packed an appropriate sized peek interbody cage  with local autograft and  morcellized allograft, gently retracted the nerve root, and tapped the cage into position at L4-5.  The midline between the cages was packed with morselized autograft and allograft. We then turned our attention to the placement of the lower pedicle screws. The pedicle screw entry zones were identified utilizing surface landmarks and fluoroscopy. I drilled into each pedicle utilizing the hand drill and EMG monitoring, and tapped each pedicle with the appropriate tap. We palpated with a ball probe to assure no break in the cortex. We then placed 5-0 x 30 mm cortical pedicle screws into the pedicles bilaterally at L5. We then decorticated the transverse processes and laid a mixture of morcellized autograft and allograft out over these to perform intertransverse arthrodesis at L4-5 right. We then placed lordotic rods into the multiaxial screw heads of the pedicle screws and locked these in position with the locking caps and anti-torque device. We then checked our construct with AP and lateral fluoroscopy. Irrigated with copious amounts of bacitracin-containing saline solution. Placed a medium Hemovac drain through separate stab incision. Inspected the nerve roots once again to assure adequate decompression, lined to the dura with Gelfoam, and closed the muscle and the fascia with 0 Vicryl. Closed the subcutaneous tissues with 2-0 Vicryl and subcuticular tissues with 3-0 Vicryl. The skin was closed with benzoin and Steri-Strips. Dressing was then applied, the patient was awakened from general anesthesia and transported to the recovery room in stable condition. At the end of the procedure all sponge, needle and instrument counts were correct.   PLAN OF CARE: Admit to inpatient   PATIENT DISPOSITION:  PACU - hemodynamically stable.   Delay start of Pharmacological VTE agent (>24hrs) due to surgical blood loss or risk of bleeding:  yes

## 2015-01-21 NOTE — Anesthesia Procedure Notes (Signed)
Procedure Name: Intubation Date/Time: 01/21/2015 10:44 AM Performed by: Carmela RimaMARTINELLI, Taylia Berber F Pre-anesthesia Checklist: Patient being monitored, Suction available, Emergency Drugs available, Patient identified and Timeout performed Patient Re-evaluated:Patient Re-evaluated prior to inductionOxygen Delivery Method: Circle system utilized Preoxygenation: Pre-oxygenation with 100% oxygen Intubation Type: IV induction Ventilation: Mask ventilation without difficulty Laryngoscope Size: Mac and 3 Grade View: Grade I Tube type: Oral Tube size: 7.0 mm Number of attempts: 1 Placement Confirmation: positive ETCO2,  ETT inserted through vocal cords under direct vision and breath sounds checked- equal and bilateral Secured at: 21 cm Tube secured with: Tape Dental Injury: Teeth and Oropharynx as per pre-operative assessment

## 2015-01-21 NOTE — H&P (Signed)
Subjective: Patient is a 49 y.o. female admitted for degenerative spondylolisthesis. Onset of symptoms was several years ago, gradually worsening since that time.  The pain is rated intense, and is located at the across the lower back and radiates to legs. The pain is described as aching and occurs all day. The symptoms have been progressive. Symptoms are exacerbated by exercise. MRI or CT showed grade 1-2 spondylolisthesis at L4-5. She is admitted for PLIF L4-5.   Past Medical History  Diagnosis Date  . ADD (attention deficit disorder)   . Neuropathy   . Shortness of breath dyspnea     mainly d/t pain....hurts to breathe  . GERD (gastroesophageal reflux disease)   . Anemia     off and on    Past Surgical History  Procedure Laterality Date  . Foot/ankle surgery Right     2015, 2010  . Torn meniscus repair      2016  right  . Tubal ligation      Prior to Admission medications   Medication Sig Start Date End Date Taking? Authorizing Provider  amphetamine-dextroamphetamine (ADDERALL XR) 25 MG 24 hr capsule Take 1 capsule by mouth daily.  12/31/13  Yes Historical Provider, MD  HYDROcodone-acetaminophen (NORCO) 7.5-325 MG per tablet Take 1 tablet by mouth every 6 (six) hours as needed for moderate pain.   Yes Historical Provider, MD  oxycodone (OXY-IR) 5 MG capsule Take 5 mg by mouth every 4 (four) hours as needed for pain.   Yes Historical Provider, MD  ranitidine (ZANTAC) 150 MG tablet Take 150 mg by mouth 2 (two) times daily.   Yes Historical Provider, MD  tiZANidine (ZANAFLEX) 4 MG tablet Take 4 mg by mouth at bedtime.   Yes Historical Provider, MD  Vitamin D, Ergocalciferol, (DRISDOL) 50000 UNITS CAPS capsule Take 50,000 Units by mouth every 7 (seven) days. Tuesday   Yes Historical Provider, MD   No Known Allergies  History  Substance Use Topics  . Smoking status: Current Every Day Smoker -- 0.50 packs/day for 34 years    Types: Cigarettes    Start date: 07/25/1971  . Smokeless  tobacco: Never Used  . Alcohol Use: 2.4 oz/week    0 Standard drinks or equivalent, 4 Glasses of wine per week     Comment: occassionally 1-2 glasses 3-5 times per week    Family History  Problem Relation Age of Onset  . Dementia Maternal Grandmother   . Gout Father   . Diabetes Mellitus II Brother   . Scoliosis Daughter   . Other Daughter     Transposition of the great vessels  . Thyroid disease Mother   . Thyroid disease Daughter   . Diabetes Mellitus II Maternal Grandmother   . Dementia Maternal Grandmother      Review of Systems  Positive ROS: Negative  All other systems have been reviewed and were otherwise negative with the exception of those mentioned in the HPI and as above.  Objective: Vital signs in last 24 hours: Temp:  [97.8 F (36.6 C)] 97.8 F (36.6 C) (06/30 0913) Pulse Rate:  [65] 65 (06/30 0913) Resp:  [20] 20 (06/30 0913) BP: (141)/(90) 141/90 mmHg (06/30 0913) SpO2:  [99 %] 99 % (06/30 0913) Weight:  [214 lb (97.07 kg)] 214 lb (97.07 kg) (06/30 0913)  General Appearance: Alert, cooperative, no distress, appears stated age Head: Normocephalic, without obvious abnormality, atraumatic Eyes: PERRL, conjunctiva/corneas clear, EOM's intact    Neck: Supple, symmetrical, trachea midline Back: Symmetric, no curvature,  ROM normal, no CVA tenderness Lungs:  respirations unlabored Heart: Regular rate and rhythm Abdomen: Soft, non-tender Extremities: Extremities normal, atraumatic, no cyanosis or edema Pulses: 2+ and symmetric all extremities Skin: Skin color, texture, turgor normal, no rashes or lesions  NEUROLOGIC:   Mental status: Alert and oriented x4,  no aphasia, good attention span, fund of knowledge, and memory Motor Exam - grossly normal Sensory Exam - grossly normal Reflexes: 1+ Coordination - grossly normal Gait - grossly normal Balance - grossly normal Cranial Nerves: I: smell Not tested  II: visual acuity  OS: nl    OD: nl  II: visual  fields Full to confrontation  II: pupils Equal, round, reactive to light  III,VII: ptosis None  III,IV,VI: extraocular muscles  Full ROM  V: mastication Normal  V: facial light touch sensation  Normal  V,VII: corneal reflex  Present  VII: facial muscle function - upper  Normal  VII: facial muscle function - lower Normal  VIII: hearing Not tested  IX: soft palate elevation  Normal  IX,X: gag reflex Present  XI: trapezius strength  5/5  XI: sternocleidomastoid strength 5/5  XI: neck flexion strength  5/5  XII: tongue strength  Normal    Data Review Lab Results  Component Value Date   WBC 8.8 01/13/2015   HGB 14.9 01/13/2015   HCT 43.8 01/13/2015   MCV 98.9 01/13/2015   PLT 302 01/13/2015   Lab Results  Component Value Date   NA 139 01/13/2015   K 4.3 01/13/2015   CL 103 01/13/2015   CO2 24 01/13/2015   BUN 13 01/13/2015   CREATININE 0.83 01/13/2015   GLUCOSE 87 01/13/2015   Lab Results  Component Value Date   INR 0.97 01/13/2015    Assessment/Plan: Patient admitted for PLIF L4-5 for her degenerative spondylolisthesis. Patient has failed a reasonable attempt at conservative therapy.  I explained the condition and procedure to the patient and answered any questions.  Patient wishes to proceed with procedure as planned. Understands risks/ benefits and typical outcomes of procedure.   Rorey Bisson S 01/21/2015 9:58 AM

## 2015-01-21 NOTE — Progress Notes (Signed)
[facility] need [facility] have [facility] drawn for type and screen, states they can do it up there.

## 2015-01-22 ENCOUNTER — Encounter (HOSPITAL_COMMUNITY): Payer: Self-pay | Admitting: Neurological Surgery

## 2015-01-22 MED ORDER — OXYCODONE HCL 5 MG PO TABS: 5.0000 mg | ORAL_TABLET | Freq: Four times a day (QID) | ORAL | Status: DC | PRN

## 2015-01-22 NOTE — Evaluation (Signed)
Physical Therapy Evaluation Patient Details Name: Amber Mcmahon MRN: [id number] DOB: 1965/10/05 Today's Date: 01/22/2015   History of Present Illness  Pt is a 49 y/o female admitted s/p elective L4-5 PLIF on 01/21/15.  Clinical Impression  Pt admitted with above diagnosis. Pt currently with functional limitations due to the deficits listed below (see PT Problem List). At the time of PT eval pt was able to perform transfers and ambulation with occasional hands-on guarding/assist for safety. Pt very anxious at times which limits safety. She was not able to safely perform bed mobility, and it was recommended that pt sleep in the recliner (as she has been doing) until home health PT can assist her with safe bed mobility techniques. Feel home health needs to be present in the home as soon as able after d/c for safety. Pt will benefit from skilled PT to increase their independence and safety with mobility to allow discharge to the venue listed below.       Follow Up Recommendations Home health PT;Supervision/Assistance - 24 hour    Equipment Recommendations  None recommended by PT    Recommendations for Other Services       Precautions / Restrictions Precautions Precautions: Fall;Back Precaution Booklet Issued: Yes (comment) Precaution Comments: Reviewed back precautions with pt and husband. Required Braces or Orthoses: Spinal Brace Spinal Brace: Lumbar corset;Applied in sitting position Restrictions Weight Bearing Restrictions: No      Mobility  Bed Mobility Overal bed mobility: Needs Assistance Bed Mobility: Sit to Sidelying           General bed mobility comments: Pt was unable to complete task of sit>sidelying, even with multiple attempts and assistance. Strongly advised against pt crawling into the bed forwards onto her knees and flopping onto her side as she describes she has been doing at home.   Transfers Overall transfer level: Needs assistance Equipment used:  None Transfers: Sit to/from Stand Sit to Stand: Min guard         General transfer comment: Pt reaching and grabbing for her husband to assist. Pt was cued for hand placement on seated surface for safety.   Ambulation/Gait Ambulation/Gait assistance: Min guard Ambulation Distance (Feet): 125 Feet Assistive device: None Gait Pattern/deviations: Step-through pattern;Decreased stride length;Narrow base of support;Drifts right/left Gait velocity: Decreased Gait velocity interpretation: Below normal speed for age/gender General Gait Details: Occasional hands-on assist was provided for safety and steadying.  Stairs Stairs: Yes Stairs assistance: Min assist Stair Management: No rails;Forwards Number of Stairs: 4 General stair comments: Instructed husband in assisting pt at home as they have no hand rails. Husband provided hands-on assist throughout stair training.   Wheelchair Mobility    Modified Rankin (Stroke Patients Only)       Balance Overall balance assessment: Needs assistance Sitting-balance support: Feet supported;No upper extremity supported Sitting balance-Leahy Scale: Fair     Standing balance support: No upper extremity supported;During functional activity Standing balance-Leahy Scale: Fair                               Pertinent Vitals/Pain Pain Assessment: Faces Faces Pain Scale: Hurts whole lot Pain Location: back during bed mobility tasks Pain Descriptors / Indicators: Operative site guarding;Grimacing Pain Intervention(s): Limited activity within patient's tolerance;Monitored during session;Repositioned    Home Living Family/patient expects to be discharged to:: Private residence Living Arrangements: Spouse/significant other Available Help at Discharge: Family;Available 24 hours/day Type of Home: House Home Access: Stairs to enter  Entrance Stairs-Rails: None Entrance Stairs-Number of Steps: 3 Home Layout: Two level;Able to live on main  level with bedroom/bathroom Home Equipment: Dan HumphreysWalker - 2 wheels;Bedside commode      Prior Function Level of Independence: Independent               Hand Dominance   Dominant Hand: Right    Extremity/Trunk Assessment   Upper Extremity Assessment: Defer to OT evaluation           Lower Extremity Assessment: RLE deficits/detail;LLE deficits/detail RLE Deficits / Details: Pt reports RLE weaker than L from pain prior to surgery, however appeared that the LLE was more impaired during functional activity.    Cervical / Trunk Assessment: Normal  Communication   Communication: No difficulties  Cognition Arousal/Alertness: Awake/alert Behavior During Therapy: Anxious Overall Cognitive Status: Within Functional Limits for tasks assessed       Memory: Decreased recall of precautions              General Comments      Exercises        Assessment/Plan    PT Assessment Patient needs continued PT services  PT Diagnosis Difficulty walking;Abnormality of gait;Acute pain   PT Problem List Decreased strength;Decreased range of motion;Decreased activity tolerance;Decreased balance;Decreased mobility;Decreased knowledge of use of DME;Decreased safety awareness;Decreased knowledge of precautions;Pain  PT Treatment Interventions DME instruction;Gait training;Stair training;Functional mobility training;Therapeutic activities;Therapeutic exercise;Neuromuscular re-education;Patient/family education   PT Goals (Current goals can be found in the Care Plan section) Acute Rehab PT Goals Patient Stated Goal: Return home as soon as possible PT Goal Formulation: With patient/family Time For Goal Achievement: 01/29/15 Potential to Achieve Goals: Good    Frequency Min 5X/week   Barriers to discharge        Co-evaluation               End of Session Equipment Utilized During Treatment: Back brace Activity Tolerance: No increased pain Patient left: with family/visitor  present;Other (comment) (Standing at tray table) Nurse Communication: Mobility status         Time: 4098-11910942-1005 PT Time Calculation (min) (ACUTE ONLY): 23 min   Charges:   PT Evaluation $Initial PT Evaluation Tier I: 1 Procedure PT Treatments $Gait Training: 8-22 mins   PT G Codes:        Conni SlipperKirkman, Jaymison Luber 01/22/2015, 10:54 AM  Conni SlipperLaura Latora Quarry, PT, DPT Acute Rehabilitation Services Pager: [phone]

## 2015-01-22 NOTE — Progress Notes (Signed)
Patient alert and oriented, mae's well, voiding adequate amount of urine, swallowing without difficulty, c/o moderate pain and meds given prior to discharged. Patient discharged home with family. Script and discharged instructions given to patient. Patient and family stated understanding of instructions given.  

## 2015-01-22 NOTE — Discharge Instructions (Signed)
Wound Care Keep incision covered and dry for 3 days. You may remove outer bandage.  Do not put any creams, lotions, or ointments on incision. Leave steri-strips on back.  They will fall off by themselves. Activity Walk each and every day, increasing distance each day. No lifting greater than 5 lbs.  Avoid bending, arching, or twisting. No driving for 2 weeks; may ride as a passenger locally. If provided with back brace, wear when out of bed.  It is not necessary to wear in bed. Diet Resume your normal diet.  Return to Work Will be discussed at you follow up appointment. Call Your Doctor If Any of These Occur Redness, drainage, or swelling at the wound.  Temperature greater than 101 degrees. Severe pain not relieved by pain medication. Incision starts to come apart. Follow Up Appt Call today for appointment in 1-2 weeks [phone] or for problems.  If you have any hardware placed in your spine, you will need an x-ray before your appointment.

## 2015-01-22 NOTE — Evaluation (Signed)
Occupational Therapy Evaluation and Discharge Patient Details Name: Amber Mcmahon MRN: [id number] DOB: [birth year] Today's Date: 01/22/2015    History of Present Illness Pt is a 49 y/o female admitted s/p elective L4-5 PLIF on 01/21/15.   Clinical Impression   This 49 yo female admitted and underwent above presents to acute OT with all education completed with pt and family/friend. We will D/C from acute OT.    Follow Up Recommendations  No OT follow up    Equipment Recommendations  None recommended by OT       Precautions / Restrictions Precautions Precautions: Fall;Back Precaution Booklet Issued: Yes (comment) Precaution Comments: Reviewed back precautions with pt and husband. Required Braces or Orthoses: Spinal Brace Spinal Brace: Lumbar corset;Applied in sitting position Restrictions Weight Bearing Restrictions: No      Mobility Bed Mobility General bed mobility comments: Pt unable to even get to a sitting position EOB from standing due to pain while I was in the room with her   Transfers Overall transfer level: Needs assistance         General transfer comment: Pt unable to even get to a sitting position EOB from standing due to pain while I was in the room with her          ADL                                         General ADL Comments: husband A her with LBADLs, she has a Sports administratorreacher but had never thought to use it for LBD--I explained to her how to use it. She says he usually she stands supported against a wall and lifts up her leg to get her legs in underwear and pants--I told her this was not the safest way to do this, but if she had to do it this way to make sure she had her brace on. She reports that she does not have any issues with toileting--I recommended she use wet wipes for ease of cleaing and to avoid twisting--I offered to show her the toilet aid, but she politely refused. I edcuated her on how to get her teeth brushed an avoid  bending over the sink.     Vision Additional Comments: No change from baseline          Pertinent Vitals/Pain Pain Assessment: Faces Faces Pain Scale: Hurts whole lot Pain Location: back during bed mobility tasks Pain Descriptors / Indicators: Operative site guarding;Grimacing Pain Intervention(s): Limited activity within patient's tolerance;Monitored during session;Repositioned     Hand Dominance Right   Extremity/Trunk Assessment Upper Extremity Assessment Upper Extremity Assessment: Overall WFL for tasks assessed     Communication Communication Communication: No difficulties   Cognition Arousal/Alertness: Awake/alert Behavior During Therapy: Anxious (due to pain) Overall Cognitive Status: Within Functional Limits for tasks assessed       Memory: Decreased recall of precautions                        Home Living Family/patient expects to be discharged to:: Private residence Living Arrangements: Spouse/significant other Available Help at Discharge: Family;Available 24 hours/day Type of Home: House Home Access: Stairs to enter Entergy CorporationEntrance Stairs-Number of Steps: 3 Entrance Stairs-Rails: None Home Layout: Two level;Able to live on main level with bedroom/bathroom Alternate Level Stairs-Number of Steps: Flight Alternate Level Stairs-Rails: Right Bathroom Shower/Tub: Tub/shower unit Shower/tub characteristics:  (one with  curtain and one with door) Bathroom Toilet: Standard     Home Equipment: Walker - 2 wheels;Bedside commode (reacher)          Prior Functioning/Environment Level of Independence: Independent             OT Diagnosis: Generalized weakness;Acute pain         OT Goals(Current goals can be found in the care plan section) Acute Rehab OT Goals Patient Stated Goal: home today  OT Frequency:                End of Session Equipment Utilized During Treatment: Back brace Nurse Communication:  (Pt ready to go from therapy  standpoint)  Activity Tolerance: Patient limited by pain Patient left:  (standing in room with family and friend)   Time: 4098-1191 OT Time Calculation (min): 8 min Charges:  OT General Charges $OT Visit: 1 Procedure OT Evaluation $Initial OT Evaluation Tier I: 1 Procedure  Evette Georges [phone] 01/22/2015, 12:48 PM

## 2015-01-22 NOTE — Progress Notes (Signed)
Patient unable to receive second dose of antibiotic this evening because of pain at the IV site even when RN flushed it easily. Patient very emotional this evening crying several times over pain and discomfort. Will continue to monitor. Ambrie Carte, [place] ScrapeSarah E, RN

## 2015-01-22 NOTE — Care Management Note (Signed)
Case Management Note  Patient Details  Name: Amber AveCharlene Budzynski MRN: [id number] Date of Birth: March 19, 1966  Subjective/Objective:          S/p  PLIF L4-5         Action/Plan: Spoke to patient and her husband about home health,[facility] [place] [facility] and [facility] HH[facility].   Expected Discharge Date:                  Expected Discharge Plan:  Home w Home Health Services  In-House Referral:  NA  Discharge planning Services  CM Consult  Post Acute Care Choice:  Home Health, Durable Medical Equipment Choice offered to:  Patient, Spouse  DME Arranged:  Walker rolling DME Agency:  [facility]  HH Arranged:  PT HH Agency:  [facility]  Status of Service:  Completed, signed off  Medicare Important Message Given:    Date Medicare IM Given:    Medicare IM give by:    Date Additional Medicare IM Given:    Additional Medicare Important Message give by:     If discussed at Long Length of Stay Meetings, dates discussed:    Additional Comments:  Monica BectonKrieg, Berlie Persky Watson, RN 01/22/2015, 10:59 AM

## 2015-01-22 NOTE — Discharge Summary (Signed)
Physician Discharge Summary  Patient ID: Amber AveCharlene Sandt MRN: [id number] DOB/AGE: 02/08/1966 49 y.o.  Admit date: 01/21/2015 Discharge date: 01/22/2015  Admission Diagnoses: Spondylolisthesis L4-5   Discharge Diagnoses: Same   Discharged Condition: good  Hospital Course: The patient was admitted on 01/21/2015 and taken to the operating room where the patient underwent PLIF L4-5. The patient tolerated the procedure well and was taken to the recovery room and then to the floor in stable condition. The hospital course was routine. There were no complications. The wound remained clean dry and intact. Pt had appropriate back soreness. No complaints of leg pain or new N/T/W. The patient remained afebrile with stable vital signs, and tolerated a regular diet. The patient continued to increase activities, and pain was well controlled with oral pain medications.   Consults: None  Significant Diagnostic Studies:  Results for orders placed or performed during the hospital encounter of 01/21/15  Type and screen  Result Value Ref Range   ABO/RH(D) O POS    Antibody Screen NEG    Sample Expiration 01/24/2015   ABO/Rh  Result Value Ref Range   ABO/RH(D) O POS     Dg Chest 2 View  01/21/2015   CLINICAL DATA:  49 year old female with a history of shortness of breath.  EXAM: CHEST - 2 VIEW  COMPARISON:  01/13/2015  FINDINGS: Cardiomediastinal silhouette projects within normal limits in size and contour. No confluent airspace disease, pneumothorax, or pleural effusion. Coarse interstitial markings again noted.  No displaced fracture. Accentuated kyphotic curvature, similar to prior.  Unremarkable appearance of the upper abdomen.  IMPRESSION: No radiographic evidence of acute cardiopulmonary disease, with a background of chronic changes.  Signed,  Yvone NeuJaime S. Amber AveWagner, DO  [facility]  [facility]   Electronically Signed   By: Gilmer MorJaime  Wagner D.O.   On: 01/21/2015 09:29    Chest 2 View  01/13/2015   CLINICAL DATA:  Productive cough.  EXAM: CHEST  2 VIEW  COMPARISON:  None.  FINDINGS: Mediastinum hilar structures normal. Mild lingular infiltrate noted suggesting pneumonia. Heart size normal. Mild lower thoracic spine compression fractures, age undetermined noted . Diffuse osteopenia degenerative change.  IMPRESSION: 1. Mild lingular infiltrate suggesting pneumonia. 2. Mild lower thoracic spine compression fractures, age undetermined. Diffuse osteopenia degenerative change.   Electronically Signed   By: Maisie Fushomas  Register   On: 01/13/2015 13:18   Dg Lumbar Spine 2-3 Views  01/21/2015   CLINICAL DATA:  Operative imaging for lumbar spine fusion surgery.  EXAM: DG C-ARM 61-120 MIN; LUMBAR SPINE - 2-3 VIEW  COMPARISON:  12/29/2014  FINDINGS: Bilateral pedicle screws interconnecting rods fuse L4-L5. There is a radiolucent disc spacer, well centered, maintaining disc space height at the L4-L5 level.  Orthopedic hardware well-seated and aligned. There is no acute fracture or evidence of an operative complication.  IMPRESSION: Operative images from a L4-L5 lumbar spine posterior fusion as described.   Electronically Signed   By: Amie Portlandavid  Ormond M.D.   On: 01/21/2015 13:49   Dg C-arm 1-60 Min  01/21/2015   CLINICAL DATA:  Operative imaging for lumbar spine fusion surgery.  EXAM: DG C-ARM 61-120 MIN; LUMBAR SPINE - 2-3 VIEW  COMPARISON:  12/29/2014  FINDINGS: Bilateral pedicle screws interconnecting rods fuse L4-L5. There is a radiolucent disc spacer, well centered, maintaining disc space height at the L4-L5 level.  Orthopedic hardware well-seated and aligned. There is no acute fracture or evidence of an operative complication.  IMPRESSION: Operative images from a L4-L5 lumbar  spine posterior fusion as described.   Electronically Signed   By: Amie Portland M.D.   On: 01/21/2015 13:49    Antibiotics:  Anti-infectives    Start     Dose/Rate Route Frequency Ordered Stop   01/21/15 1830   ceFAZolin (ANCEF) IVPB 1 g/50 mL premix     1 g 100 mL/hr over 30 Minutes Intravenous Every 8 hours 01/21/15 1540 01/22/15 1029   01/21/15 1136  bacitracin 50,000 Units in sodium chloride irrigation 0.9 % 500 mL irrigation  Status:  Discontinued       As needed 01/21/15 1136 01/21/15 1338   01/21/15 1000  ceFAZolin (ANCEF) IVPB 2 g/50 mL premix     2 g 100 mL/hr over 30 Minutes Intravenous To [facility] 01/20/15 1209 01/21/15 1106      Discharge Exam: Blood pressure 141/57, pulse 74, temperature 98.8 F (37.1 C), temperature source Oral, resp. rate 20, height 5' 6.5" (1.689 m), weight 214 lb (97.07 kg), SpO2 93 %. Neurologic: Grossly normal Dressing dry  Discharge Medications:     Medication List    STOP taking these medications        HYDROcodone-acetaminophen 7.5-325 MG per tablet  Commonly known as:  NORCO     oxycodone 5 MG capsule  Commonly known as:  OXY-IR  Replaced by:  oxyCODONE 5 MG immediate release tablet      TAKE these medications        amphetamine-dextroamphetamine 25 MG 24 hr capsule  Commonly known as:  ADDERALL XR  Take 1 capsule by mouth daily.     oxyCODONE 5 MG immediate release tablet  Commonly known as:  Oxy IR/ROXICODONE  Take 1-2 tablets (5-10 mg total) by mouth every 6 (six) hours as needed for moderate pain or severe pain.     ranitidine 150 MG tablet  Commonly known as:  ZANTAC  Take 150 mg by mouth 2 (two) times daily.     tiZANidine 4 MG tablet  Commonly known as:  ZANAFLEX  Take 4 mg by mouth at bedtime.     Vitamin D (Ergocalciferol) 50000 UNITS Caps capsule  Commonly known as:  DRISDOL  Take 50,000 Units by mouth every 7 (seven) days. Tuesday        Disposition: Home   Final Dx: PLIF L4-5      Discharge Instructions     Remove dressing in 72 hours    Complete by:  As directed      Call MD for:  difficulty breathing, headache or visual disturbances    Complete by:  As directed      Call MD for:  persistant  nausea and vomiting    Complete by:  As directed      Call MD for:  redness, tenderness, or signs of infection (pain, swelling, redness, odor or green/yellow discharge around incision site)    Complete by:  As directed      Call MD for:  severe uncontrolled pain    Complete by:  As directed      Call MD for:  temperature >100.4    Complete by:  As directed      Diet - low sodium heart healthy    Complete by:  As directed      Discharge instructions    Complete by:  As directed   May shower, no driving, no bending or twisting, no heavy lifting     Increase activity slowly    Complete by:  As directed  Follow-up Information    Follow up with Sandar Krinke S, MD In 2 weeks.   Specialty:  Neurosurgery   Contact information:   [address] [place] [address] [phone]        Signed: Tia Alert 01/22/2015, 7:49 AM

## 2015-01-22 NOTE — Anesthesia Postprocedure Evaluation (Signed)
  Anesthesia Post-op Note  Patient: Amber Mcmahon  Procedure(s) Performed: Procedure(s) (LRB): FOR MAXIMUM ACCESS (MAS) POSTERIOR LUMBAR INTERBODY FUSION (PLIF) 1 LEVEL (N/A)  Patient Location: PACU  Anesthesia Type: General  Level of Consciousness: awake and alert   Airway and Oxygen Therapy: Patient Spontanous Breathing  Post-op Pain: mild  Post-op Assessment: Post-op Vital signs reviewed, Patient's Cardiovascular Status Stable, Respiratory Function Stable, Patent Airway and No signs of Nausea or vomiting  Last Vitals:  Filed Vitals:   01/22/15 0729  BP: 141/57  Pulse: 74  Temp: 37.1 C  Resp: 20    Post-op Vital Signs: stable   Complications: No apparent anesthesia complications

## 2015-12-25 ENCOUNTER — Emergency Department (HOSPITAL_COMMUNITY)
Admission: EM | Admit: 2015-12-25 | Discharge: 2015-12-25 | Disposition: A | Discharge status: Home / Self Care | Payer: BLUE CROSS/BLUE SHIELD | Attending: Emergency Medicine | Admitting: Emergency Medicine

## 2015-12-25 ENCOUNTER — Encounter (HOSPITAL_COMMUNITY): Payer: Self-pay | Admitting: Nurse Practitioner

## 2015-12-25 ENCOUNTER — Emergency Department (HOSPITAL_COMMUNITY): Payer: BLUE CROSS/BLUE SHIELD

## 2015-12-25 DIAGNOSIS — Z79899 Other long term (current) drug therapy: Secondary | ICD-10-CM | POA: Insufficient documentation

## 2015-12-25 DIAGNOSIS — Z79891 Long term (current) use of opiate analgesic: Secondary | ICD-10-CM | POA: Diagnosis not present

## 2015-12-25 DIAGNOSIS — F17210 Nicotine dependence, cigarettes, uncomplicated: Secondary | ICD-10-CM | POA: Diagnosis not present

## 2015-12-25 DIAGNOSIS — J069 Acute upper respiratory infection, unspecified: Secondary | ICD-10-CM | POA: Diagnosis not present

## 2015-12-25 DIAGNOSIS — R05 Cough: Secondary | ICD-10-CM

## 2015-12-25 DIAGNOSIS — B9789 Other viral agents as the cause of diseases classified elsewhere: Secondary | ICD-10-CM

## 2015-12-25 DIAGNOSIS — R059 Cough, unspecified: Secondary | ICD-10-CM

## 2015-12-25 LAB — MONONUCLEOSIS SCREEN: Mono Screen: NEGATIVE

## 2015-12-25 MED ORDER — BENZONATATE 100 MG PO CAPS: 100.0000 mg | ORAL_CAPSULE | Freq: Three times a day (TID) | ORAL | Status: DC

## 2015-12-25 NOTE — ED Notes (Signed)
Lab advised is going to obtain tube sent and run it now for mono spot. Norva KarvonenH Laisure, PA, aware - pt requested to be called w/results. Verified home and cell numbers are correct.

## 2015-12-25 NOTE — ED Notes (Signed)
H Laisure, PA, in w/pt. 

## 2015-12-25 NOTE — ED Provider Notes (Signed)
CSN: [id number]     Arrival date & time 12/25/15  1819 History  By signing my name below, I, Linna Darner, attest that this documentation has been prepared under the direction and in the presence of [employer], PA-C. Electronically Signed: Linna Darner, Scribe. 12/25/2015. 7:11 PM.     Chief Complaint  Patient presents with  . URI    The history is provided by the patient. No language interpreter was used.     HPI Comments: Amber Mcmahon is a 50 y.o. female who presents to the Emergency Department with multiple complaints for the last 10 days. She endorses headache, sore throat, intermittent diarrhea, intermittent subjective fever,  severe fatigue, dry cough, SOB, nasal congestion, and intermittent nausea. Pt has had diarrhea today x2. She has not measured her temperature at home. She endorses severe difficulty staying awake since onset. Pt reports that she attended a [employer] festival in [place] this past Mother's Day and was potentially around a lot of sick people; she reports that she experienced symptoms similar to what she has now for several days after the festival, but the symptoms resolved completely. Pt has tried Tylenol Cold & Flu, Dayquil, and Nyquil for her current symptoms with no relief. She reports that she had mononucleosis as a teenager and her current symptoms feel similar. She denies vomiting, hematochezia, or any other associated symptoms.  Past Medical History  Diagnosis Date  . ADD (attention deficit disorder)   . Neuropathy (HCC)   . Shortness of breath dyspnea     mainly d/t pain....hurts to breathe  . GERD (gastroesophageal reflux disease)   . Anemia     off and on   Past Surgical History  Procedure Laterality Date  . Foot/ankle surgery Right     2015, 2010  . Torn meniscus repair      2016  right  . Tubal ligation    . Maximum access (mas)posterior lumbar interbody fusion (plif) 1 level N/A 01/21/2015    Procedure: FOR MAXIMUM ACCESS (MAS) POSTERIOR  LUMBAR INTERBODY FUSION (PLIF) 1 LEVEL;  Surgeon: Tia Alert, MD;  Location: [facility];  Service: Neurosurgery;  Laterality: N/A;  FOR MAXIMUM ACCESS (MAS) POSTERIOR LUMBAR INTERBODY FUSION (PLIF) 1 LEVEL   Family History  Problem Relation Age of Onset  . Dementia Maternal Grandmother   . Gout Father   . Diabetes Mellitus II Brother   . Scoliosis Daughter   . Other Daughter     Transposition of the great vessels  . Thyroid disease Mother   . Thyroid disease Daughter   . Diabetes Mellitus II Maternal Grandmother   . Dementia Maternal Grandmother    Social History  Substance Use Topics  . Smoking status: Current Every Day Smoker -- 0.50 packs/day for 34 years    Types: Cigarettes    Start date: 07/25/1971  . Smokeless tobacco: Never Used  . Alcohol Use: 2.4 oz/week    0 Standard drinks or equivalent, 4 Glasses of wine per week     Comment: occassionally 1-2 glasses 3-5 times per week   OB History    No data available     Review of Systems  A complete 10 system review of systems was obtained and all systems are negative except as noted in the HPI and PMH.   Allergies  Review of patient's allergies indicates no known allergies.  Home Medications   Prior to Admission medications   Medication Sig Start Date End Date Taking? Authorizing Provider  amphetamine-dextroamphetamine (  ADDERALL XR) 25 MG 24 hr capsule Take 1 capsule by mouth daily.  12/31/13   Historical Provider, MD  oxyCODONE (OXY IR/ROXICODONE) 5 MG immediate release tablet Take 1-2 tablets (5-10 mg total) by mouth every 6 (six) hours as needed for moderate pain or severe pain. 01/22/15   Tia Alertavid S Jones, MD  ranitidine (ZANTAC) 150 MG tablet Take 150 mg by mouth 2 (two) times daily.    Historical Provider, MD  tiZANidine (ZANAFLEX) 4 MG tablet Take 4 mg by mouth at bedtime.    Historical Provider, MD  Vitamin D, Ergocalciferol, (DRISDOL) 50000 UNITS CAPS capsule Take 50,000 Units by mouth every 7 (seven) days.  Tuesday    Historical Provider, MD   BP 129/82 mmHg  Pulse 77  Temp(Src) 98.5 F (36.9 C) (Oral)  Resp 16  SpO2 100% Physical Exam  Constitutional: She is oriented to person, place, and time. She appears well-developed and well-nourished. No distress.  HENT:  Head: Normocephalic and atraumatic.  Mouth/Throat: Oropharynx is clear and moist. No oropharyngeal exudate, posterior oropharyngeal edema or posterior oropharyngeal erythema.  oropharynx clear and moist; no exudate, erythema, or edema. No TTP of the sinuses.  Neck: Neck supple. No tracheal deviation present.  Cardiovascular: Normal rate, regular rhythm and normal heart sounds.   Pulmonary/Chest: Effort normal and breath sounds normal. No respiratory distress.  Musculoskeletal: Normal range of motion.  Neurological: She is alert and oriented to person, place, and time.  Skin: Skin is warm and dry.  Psychiatric: She has a normal mood and affect. Her behavior is normal.  Nursing note and vitals reviewed.   ED Course  Procedures (including critical care time)  DIAGNOSTIC STUDIES: Oxygen Saturation is 100% on RA, normal by my interpretation.    COORDINATION OF CARE: 7:11 PM Discussed treatment plan with pt at bedside and pt agreed to plan.  Labs Review Labs Reviewed - No data to display  Imaging Review Dg Chest 2 View  12/25/2015  CLINICAL DATA:  Cough.  Fever. EXAM: CHEST  2 VIEW COMPARISON:  January 21, 2015 FINDINGS: The heart size and mediastinal contours are within normal limits. Both lungs are clear. The visualized skeletal structures are unremarkable. IMPRESSION: No active cardiopulmonary disease. Electronically Signed   By: Gerome Samavid  Williams III M.D   On: 12/25/2015 19:58   I have personally reviewed and evaluated these images and lab results as part of my medical decision-making.   EKG Interpretation None      MDM   Final diagnoses:  None  Patient presents today with complaints of headache, fatigue, cough, sore  throat, and congestion.  CXR is negative.  Symptoms most consistent with Viral illness.  Feel that the patient is stable for discharge.  Return precautions given.  I personally performed the services described in this documentation, which was scribed in my presence. The recorded information has been reviewed and is accurate.   Santiago GladHeather Kaleb Linquist, PA-C 12/26/15 0024  Marily MemosJason Mesner, MD 12/26/15 [phone]

## 2015-12-25 NOTE — ED Notes (Signed)
Pt c/o 4 day history fatigue, sore throat, headaches, body aches, fevers, cough. She tried tylenol, nyquil and dayquil with no relief. She is alert and breathing easily

## 2016-05-22 ENCOUNTER — Encounter (HOSPITAL_COMMUNITY): Payer: Self-pay

## 2016-05-22 ENCOUNTER — Emergency Department (HOSPITAL_COMMUNITY)
Admission: EM | Admit: 2016-05-22 | Discharge: 2016-05-22 | Disposition: A | Discharge status: Home / Self Care | Payer: BLUE CROSS/BLUE SHIELD | Attending: Emergency Medicine | Admitting: Emergency Medicine

## 2016-05-22 DIAGNOSIS — Z5321 Procedure and treatment not carried out due to patient leaving prior to being seen by health care provider: Secondary | ICD-10-CM | POA: Insufficient documentation

## 2016-05-22 DIAGNOSIS — R079 Chest pain, unspecified: Secondary | ICD-10-CM | POA: Diagnosis not present

## 2016-05-22 NOTE — ED Triage Notes (Signed)
Patient complains of right sided chest and right jaw pain x 2 hours. States that the pain is worse with movement. Denies cold/cough, alert and oriented.

## 2016-05-22 NOTE — ED Notes (Signed)
Pt states that it's "crazy" and she's going home.

## 2016-05-22 NOTE — ED Notes (Signed)
Phlebotomy attempt x2  unsuccessful 

## 2016-10-19 ENCOUNTER — Ambulatory Visit (INDEPENDENT_AMBULATORY_CARE_PROVIDER_SITE_OTHER): Payer: BLUE CROSS/BLUE SHIELD | Admitting: Podiatry

## 2016-10-19 ENCOUNTER — Encounter: Payer: Self-pay | Admitting: Podiatry

## 2016-10-19 VITALS — BP 140/79 | HR 72 | Ht 66.0 in | Wt 225.0 lb

## 2016-10-19 DIAGNOSIS — M7662 Achilles tendinitis, left leg: Secondary | ICD-10-CM

## 2016-10-19 DIAGNOSIS — M7752 Other enthesopathy of left foot: Secondary | ICD-10-CM

## 2016-10-19 DIAGNOSIS — M71572 Other bursitis, not elsewhere classified, left ankle and foot: Secondary | ICD-10-CM | POA: Diagnosis not present

## 2016-10-19 MED ORDER — MELOXICAM 15 MG PO TABS: 15.0000 mg | ORAL_TABLET | Freq: Every day | ORAL | 0 refills | Status: DC

## 2016-10-19 MED ORDER — DICLOFENAC EPOLAMINE 1.3 % TD PTCH: 1.0000 | MEDICATED_PATCH | Freq: Two times a day (BID) | TRANSDERMAL | 1 refills | Status: DC

## 2016-10-19 NOTE — Progress Notes (Signed)
SUBJECTIVE: 51 y.o. year old female presents complaining of pain in back of left heel for duration of 4 months. 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 Been on pain medication since her shoulder surgery till a few weeks ago.  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  REVIEW OF SYSTEMS: A comprehensive review of systems was negative except for: Left shoulder rotator cuff surgery.  OBJECTIVE: DERMATOLOGIC EXAMINATION: Normal findings without any open lesions.  VASCULAR EXAMINATION OF LOWER LIMBS: All pedal pulses are palpable with normal pulsation.  Warm and swollen posterior heel at Achilles tendon insertion site. Temperature gradient from tibial crest to dorsum of foot is within normal bilateral.  NEUROLOGIC EXAMINATION OF THE LOWER LIMBS: All epicritic and tactile sensations grossly intact.   MUSCULOSKELETAL EXAMINATION: Positive for cavovarus left foot. Calcaneal varus.  ASSESSMENT: Tendonitis Bursitis left Achilles tendon. Pain left heel.  PLAN: Reviewed clinical findings and available treatment options, open and raised heel, topical medication, possible CAM walker if can tolerate. Rx. Flector patch and Meloxicam. Return in 10 days to evaluate the effect of patch and Meloxicam. Patient is to bring her CAM walker.

## 2016-10-19 NOTE — Patient Instructions (Signed)
Seen for pain in left heel. Reviewed findings and available options. Rx given for Flector patch and Meloxicam with instruction. Return in 10 days. Need custom orthotics.

## 2016-10-30 ENCOUNTER — Ambulatory Visit: Payer: BLUE CROSS/BLUE SHIELD | Admitting: Podiatry

## 2016-11-07 ENCOUNTER — Ambulatory Visit (INDEPENDENT_AMBULATORY_CARE_PROVIDER_SITE_OTHER): Payer: BLUE CROSS/BLUE SHIELD | Admitting: Podiatry

## 2016-11-07 ENCOUNTER — Encounter: Payer: Self-pay | Admitting: Podiatry

## 2016-11-07 DIAGNOSIS — M216X1 Other acquired deformities of right foot: Secondary | ICD-10-CM

## 2016-11-07 DIAGNOSIS — M71572 Other bursitis, not elsewhere classified, left ankle and foot: Secondary | ICD-10-CM

## 2016-11-07 DIAGNOSIS — M7662 Achilles tendinitis, left leg: Secondary | ICD-10-CM

## 2016-11-07 DIAGNOSIS — M79672 Pain in left foot: Secondary | ICD-10-CM

## 2016-11-07 DIAGNOSIS — M216X2 Other acquired deformities of left foot: Secondary | ICD-10-CM

## 2016-11-07 DIAGNOSIS — M79671 Pain in right foot: Secondary | ICD-10-CM | POA: Diagnosis not present

## 2016-11-07 DIAGNOSIS — M7752 Other enthesopathy of left foot: Secondary | ICD-10-CM

## 2016-11-07 DIAGNOSIS — M21961 Unspecified acquired deformity of right lower leg: Secondary | ICD-10-CM

## 2016-11-07 MED ORDER — DICLOFENAC EPOLAMINE 1.3 % TD PTCH: 1.0000 | MEDICATED_PATCH | Freq: Two times a day (BID) | TRANSDERMAL | 1 refills | Status: DC

## 2016-11-07 NOTE — Progress Notes (Signed)
SUBJECTIVE: 51 y.o. year old female presents stating that she is doing better than what is was. Using Flector patch through out the day and taking Meloxicam 15 mg daily.   HPI: Been having pain in back of left heel for duration of 4 months. 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 Been on pain medication since her shoulder surgery till a few weeks ago.  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   REVIEW OF SYSTEMS: A comprehensive review of systems was negative except for: Left shoulder rotator cuff surgery.  OBJECTIVE: DERMATOLOGIC EXAMINATION: Normal findings without any open lesions.  VASCULAR EXAMINATION OF LOWER LIMBS: All pedal pulses are palpable with normal pulsation.  Warm and swollen posterior heel at Achilles tendon insertion site. Temperature gradient from tibial crest to dorsum of foot is within normal bilateral.  NEUROLOGIC EXAMINATION OF THE LOWER LIMBS: All epicritic and tactile sensations grossly intact.   MUSCULOSKELETAL EXAMINATION: Positive for cavovarus left foot. Severe elevated first ray right. Plantar flexing right hallux with plantar medial callus.   ASSESSMENT: Tendonitis Bursitis left Achilles tendon making improvement with Flector patch and Meloxicam. Pain left heel.  PLAN: Reviewed clinical findings and available treatment options, open and raised heel, topical medication, possible CAM walker if can tolerate. Rx. Flector patch re ordered. Continue with Meloxicam as needed. Both feet casted for Orthotics.

## 2016-11-07 NOTE — Patient Instructions (Signed)
Follow up on left posterior heel pain. Doing better with Flector patch and Meloxicam. Both feet casted for Orthotics. Flector patch re ordered. Will call when orthotics are ready.

## 2016-11-14 ENCOUNTER — Other Ambulatory Visit: Payer: Self-pay | Admitting: Podiatry

## 2016-11-16 ENCOUNTER — Encounter: Payer: Self-pay | Admitting: Podiatry

## 2016-11-16 ENCOUNTER — Ambulatory Visit (INDEPENDENT_AMBULATORY_CARE_PROVIDER_SITE_OTHER): Payer: BLUE CROSS/BLUE SHIELD | Admitting: Podiatry

## 2016-11-16 DIAGNOSIS — M7662 Achilles tendinitis, left leg: Secondary | ICD-10-CM

## 2016-11-16 DIAGNOSIS — M71572 Other bursitis, not elsewhere classified, left ankle and foot: Secondary | ICD-10-CM

## 2016-11-16 DIAGNOSIS — M7752 Other enthesopathy of left foot: Secondary | ICD-10-CM

## 2016-11-16 DIAGNOSIS — M21961 Unspecified acquired deformity of right lower leg: Secondary | ICD-10-CM | POA: Diagnosis not present

## 2016-11-16 NOTE — Progress Notes (Signed)
SUBJECTIVE: 51 y.o.year old femalepresents stating that left ankle started to swell and painful a few days after she was seen here. She was able to feel fluid in her ankle.  She recalls trying to wear regular shoes that could not last but 15 minutes due to extreme pain.  She feels sharp pain at times on her back of ankle. She was doing better with Flector patch through out the day and taking Meloxicam 15 mg daily.   HPI: Been having pain in back of left heel for duration of 4 months. 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 Been on pain medication since her shoulder surgery till a few weeks ago.  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   REVIEW OF SYSTEMS: A comprehensive review of systems was negative except for: Left shoulder rotator cuff surgery.  OBJECTIVE: DERMATOLOGIC EXAMINATION: Normal findings without any open lesions.  VASCULAR EXAMINATION OF LOWER LIMBS: All pedal pulses are palpable with normal pulsation.  Warm and swollen posterior heel at Achilles tendon insertion site. Temperature gradient from tibial crest to dorsum of foot is within normal bilateral.  NEUROLOGIC EXAMINATION OF THE LOWER LIMBS: All epicritic and tactile sensations grossly intact.  Extremely sensitive and painful to light pressure left posterolateral heel.  MUSCULOSKELETAL EXAMINATION: Positive for cavovarus left foot. Severe elevated first ray right. Plantar flexing right hallux with plantar medial callus.  Radiographic examination of left heel reveal loose ossicles at tendon attachment site in lateral view.    ASSESSMENT: Partial tear left achilles tendon at attachment site. Tendonitis Bursitis left Achilles tendon with recurring pain and swelling. Pain left heel.  PLAN: Reviewed clinical findings and available treatment options, open and raised heel, topical medication, possible CAM walker if can  tolerate. Left lower limb placed in Ace wrap and Pneumatic CAM walker. Continue with Flector patch and Meloxicam as needed. Return in one week. If condition fail to improve, will discuss surgical intervention.

## 2016-11-16 NOTE — Patient Instructions (Signed)
Recurring pain and swelling on left heel. X-ray show loose bone chip at tendon insertion site. Left foot and ankle placed in Pneumatic CAM walker. Continue with Flector patch and Meloxicam as needed. Return in one week for follow up.

## 2016-11-23 ENCOUNTER — Encounter: Payer: Self-pay | Admitting: Podiatry

## 2016-11-23 ENCOUNTER — Ambulatory Visit (INDEPENDENT_AMBULATORY_CARE_PROVIDER_SITE_OTHER): Payer: BLUE CROSS/BLUE SHIELD | Admitting: Podiatry

## 2016-11-23 DIAGNOSIS — M21961 Unspecified acquired deformity of right lower leg: Secondary | ICD-10-CM | POA: Diagnosis not present

## 2016-11-23 DIAGNOSIS — M7752 Other enthesopathy of left foot: Secondary | ICD-10-CM

## 2016-11-23 DIAGNOSIS — M7662 Achilles tendinitis, left leg: Secondary | ICD-10-CM | POA: Diagnosis not present

## 2016-11-23 DIAGNOSIS — M71572 Other bursitis, not elsewhere classified, left ankle and foot: Secondary | ICD-10-CM

## 2016-11-23 MED ORDER — LIDO-CAPSAICIN-MEN-METHYL SAL 0.5-0.035-5-20 % EX PTCH: 1.0000 | MEDICATED_PATCH | CUTANEOUS | 1 refills | Status: DC

## 2016-11-23 MED ORDER — OXYCODONE HCL 5 MG PO TABS: 5.0000 mg | ORAL_TABLET | Freq: Four times a day (QID) | ORAL | 0 refills | Status: DC | PRN

## 2016-11-23 NOTE — Progress Notes (Signed)
SUBJECTIVE: 51 y.o.year old femalepresents stating that her pain is worse at night. It feels ok as long as she is off of her feet and rest. She was only sweeping that made the foot hurt. She is using Flector patch through out the day and taking Meloxicam 15 mg daily.   HPI: Been having pain in back of left heel for duration of 4 months. 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 Been on pain medication since her shoulder surgery till a few weeks ago.  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   REVIEW OF SYSTEMS: A comprehensive review of systems was negative except for: Left shoulder rotator cuff surgery.  OBJECTIVE: DERMATOLOGIC EXAMINATION: Normal findings without any open lesions.  VASCULAR EXAMINATION OF LOWER LIMBS: All pedal pulses are palpable with normal pulsation.  Warm and swollen posterior heel at Achilles tendon insertion site. Temperature gradient from tibial crest to dorsum of foot is within normal bilateral.  NEUROLOGIC EXAMINATION OF THE LOWER LIMBS: All epicritic and tactile sensations grossly intact.  Extremely sensitive and painful to light pressure left posterolateral heel.  MUSCULOSKELETAL EXAMINATION: Positive for cavovarus left foot. Severe elevated first ray right. Plantar flexing right hallux with plantar medial callus.  ASSESSMENT: Tendonitis Bursitis left Achilles tendon with recurring pain and swelling. Pain left heel.  PLAN: Reviewed clinical findings and available treatment options. Continue with Pneumatic CAM walker. Continue with Flector patch and alternate with Lidoderm patch. May use Oxicodone 5mg  as needed. Return in 2 weeks.

## 2016-11-23 NOTE — Patient Instructions (Signed)
Having more pain. Will try Lidoderm patch and pain medication. Continue with air walker and may benefit from knee scooter.  Return as needed.

## 2016-11-24 ENCOUNTER — Telehealth: Payer: Self-pay | Admitting: *Deleted

## 2016-11-24 MED ORDER — LIDO-CAPSAICIN-MEN-METHYL SAL 0.5-0.035-5-20 % EX PTCH: 1.0000 | MEDICATED_PATCH | CUTANEOUS | 1 refills | Status: AC

## 2016-11-24 NOTE — Telephone Encounter (Signed)
11/24/16 Call from patient this am. [facility] called her yesterday and they can't even order the pain patch at all, is there anything else close to this you can prescribe and also being she isn't using the pain patch can she continue using the Meloxicam.

## 2016-11-24 NOTE — Addendum Note (Signed)
Addended by: Charlett NoseSHEARD, Vernecia Umble O on: 11/24/2016 09:45 AM   Modules accepted: Orders

## 2016-12-13 ENCOUNTER — Other Ambulatory Visit: Payer: Self-pay | Admitting: Podiatry

## 2016-12-15 ENCOUNTER — Telehealth: Payer: Self-pay | Admitting: *Deleted

## 2016-12-15 ENCOUNTER — Ambulatory Visit (INDEPENDENT_AMBULATORY_CARE_PROVIDER_SITE_OTHER): Payer: BLUE CROSS/BLUE SHIELD | Admitting: Podiatry

## 2016-12-15 DIAGNOSIS — M71572 Other bursitis, not elsewhere classified, left ankle and foot: Secondary | ICD-10-CM | POA: Diagnosis not present

## 2016-12-15 DIAGNOSIS — M7732 Calcaneal spur, left foot: Secondary | ICD-10-CM | POA: Diagnosis not present

## 2016-12-15 DIAGNOSIS — M7662 Achilles tendinitis, left leg: Secondary | ICD-10-CM | POA: Diagnosis not present

## 2016-12-15 DIAGNOSIS — M7752 Other enthesopathy of left foot: Secondary | ICD-10-CM

## 2016-12-15 MED ORDER — OXYCODONE HCL 5 MG PO TABS: 5.0000 mg | ORAL_TABLET | Freq: Four times a day (QID) | ORAL | 0 refills | Status: DC | PRN

## 2016-12-15 NOTE — Patient Instructions (Signed)
Seen for acute heel pain.  Cortisone injection given to left posterior heel. Pre op consent form reviewed for Posterior calcaneal resection with Achilles tendon repair.

## 2016-12-15 NOTE — Telephone Encounter (Signed)
12/15/16 Dr.Sheard, Patient came into office this morning, Pt was in tears, she was using her scooter to come in, She states foot started hurting really bad yesterday and she can't put her weight on her foot. She wanted you to maybe give her an injection.

## 2016-12-15 NOTE — Progress Notes (Signed)
SUBJECTIVE: 51 y.o.year old femalepresents with.out appointment for severe pain on left heel. Patient is seeking cortisone injection. Patient is accompanied by her husband and uses knee scooter.  She is using Flector patch through out the day and taking Percocet 5/325 and Meloxicam 15 mg daily.   HPI: Been having pain in back of left heel for duration of 4 months. 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 Been on pain medication since her shoulder surgery till a few weeks ago.  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   REVIEW OF SYSTEMS: A comprehensive review of systems was negative except for: Left shoulder rotator cuff surgery.  OBJECTIVE: DERMATOLOGIC EXAMINATION: Normal findings without any open lesions.  VASCULAR EXAMINATION OF LOWER LIMBS: All pedal pulses are palpable with normal pulsation.  Warm and swollen posterior heel at Achilles tendon insertion site. Temperature gradient from tibial crest to dorsum of foot is within normal bilateral.  NEUROLOGIC EXAMINATION OF THE LOWER LIMBS: All epicritic and tactile sensations grossly intact.  Extremely sensitive and painful to light pressure left posterolateral heel.  MUSCULOSKELETAL EXAMINATION: Positive for cavovarus left foot. Severe elevated first ray right. Plantar flexing right hallux with plantar medial callus.  ASSESSMENT: Tendonitis Bursitis left Achilles tendon with recurring pain and swelling. Pain left heel. Difficulty walking.   PLAN: Reviewed clinical findings and available treatment options. Injected left posterior heel with mixture of 4 mg Dexamethasone, 4 mg Triamcinolone, and 1 cc of 0.5% Marcaine plain. Patient tolerated well without difficulty.  Continue with Pneumatic CAM walker. Continue with Flector patch and alternate with Lidoderm patch. May use Oxicodone 5mg  as needed. Reviewed surgery consent form for resection of  spur and abnormal bone fragment and reattach Achilles tendon on left.

## 2016-12-16 ENCOUNTER — Encounter: Payer: Self-pay | Admitting: Podiatry

## 2016-12-28 ENCOUNTER — Encounter: Payer: BLUE CROSS/BLUE SHIELD | Admitting: Podiatry

## 2017-01-04 ENCOUNTER — Ambulatory Visit: Payer: BLUE CROSS/BLUE SHIELD | Admitting: Podiatry

## 2017-01-04 ENCOUNTER — Encounter: Payer: BLUE CROSS/BLUE SHIELD | Admitting: Podiatry

## 2017-01-04 ENCOUNTER — Other Ambulatory Visit: Payer: Self-pay | Admitting: Podiatry

## 2017-01-04 MED ORDER — NAPROXEN 500 MG PO TABS: 500.0000 mg | ORAL_TABLET | Freq: Two times a day (BID) | ORAL | 1 refills | Status: DC

## 2017-01-04 MED ORDER — OXYCODONE HCL 5 MG PO TABS: 5.0000 mg | ORAL_TABLET | Freq: Four times a day (QID) | ORAL | 0 refills | Status: DC | PRN

## 2017-01-05 ENCOUNTER — Other Ambulatory Visit: Payer: Self-pay | Admitting: Podiatry

## 2017-01-05 ENCOUNTER — Ambulatory Visit (HOSPITAL_BASED_OUTPATIENT_CLINIC_OR_DEPARTMENT_OTHER)
Admission: RE | Admit: 2017-01-05 | Discharge: 2017-01-05 | Disposition: A | Discharge status: Home / Self Care | Payer: BLUE CROSS/BLUE SHIELD | Source: Ambulatory Visit | Attending: Podiatry | Admitting: Podiatry

## 2017-01-05 ENCOUNTER — Other Ambulatory Visit (HOSPITAL_BASED_OUTPATIENT_CLINIC_OR_DEPARTMENT_OTHER): Payer: Self-pay | Admitting: *Deleted

## 2017-01-05 DIAGNOSIS — T17900A Unspecified foreign body in respiratory tract, part unspecified causing asphyxiation, initial encounter: Secondary | ICD-10-CM | POA: Diagnosis present

## 2017-01-05 DIAGNOSIS — X58XXXA Exposure to other specified factors, initial encounter: Secondary | ICD-10-CM | POA: Insufficient documentation

## 2017-01-05 DIAGNOSIS — J9811 Atelectasis: Secondary | ICD-10-CM | POA: Diagnosis not present

## 2017-01-11 ENCOUNTER — Encounter: Payer: BLUE CROSS/BLUE SHIELD | Admitting: Podiatry

## 2017-01-19 ENCOUNTER — Other Ambulatory Visit (HOSPITAL_COMMUNITY): Payer: Self-pay | Admitting: Gastroenterology

## 2017-01-19 DIAGNOSIS — R1112 Projectile vomiting: Secondary | ICD-10-CM

## 2017-01-31 ENCOUNTER — Ambulatory Visit (HOSPITAL_COMMUNITY)
Admission: RE | Admit: 2017-01-31 | Discharge: 2017-01-31 | Disposition: A | Discharge status: Home / Self Care | Payer: BLUE CROSS/BLUE SHIELD | Source: Ambulatory Visit | Attending: Gastroenterology | Admitting: Gastroenterology

## 2017-01-31 DIAGNOSIS — R1112 Projectile vomiting: Secondary | ICD-10-CM | POA: Diagnosis not present

## 2017-01-31 MED ORDER — TECHNETIUM TC 99M SULFUR COLLOID: 2.0000 | Freq: Once | INTRAVENOUS | Status: DC | PRN

## 2017-02-19 ENCOUNTER — Telehealth: Payer: Self-pay | Admitting: *Deleted

## 2017-02-19 MED ORDER — OXYCODONE HCL 5 MG PO TABS: 5.0000 mg | ORAL_TABLET | Freq: Four times a day (QID) | ORAL | 0 refills | Status: DC | PRN

## 2017-02-19 NOTE — Telephone Encounter (Signed)
02/19/17 Patient called today to make an appointment to reschedule her surgery but also request an RX for Pain.

## 2017-02-26 ENCOUNTER — Ambulatory Visit (INDEPENDENT_AMBULATORY_CARE_PROVIDER_SITE_OTHER): Payer: BLUE CROSS/BLUE SHIELD | Admitting: Podiatry

## 2017-02-26 DIAGNOSIS — M7752 Other enthesopathy of left foot: Secondary | ICD-10-CM

## 2017-02-26 DIAGNOSIS — M7732 Calcaneal spur, left foot: Secondary | ICD-10-CM | POA: Diagnosis not present

## 2017-02-26 DIAGNOSIS — M71572 Other bursitis, not elsewhere classified, left ankle and foot: Secondary | ICD-10-CM | POA: Diagnosis not present

## 2017-02-26 MED ORDER — OXYCODONE-ACETAMINOPHEN 7.5-325 MG PO TABS: 1.0000 | ORAL_TABLET | Freq: Four times a day (QID) | ORAL | 0 refills | Status: DC | PRN

## 2017-02-26 NOTE — Patient Instructions (Signed)
Doing ok for now using Tylenol. But need longer lasting pain medication. Percocet 7.5/325 prescribed. Contact us to schedule surgery.

## 2017-02-27 ENCOUNTER — Encounter: Payer: Self-pay | Admitting: Podiatry

## 2017-02-27 NOTE — Progress Notes (Signed)
SUBJECTIVE: 51 y.o.year old femalepresents with left heel pain. Patient is accompanied by her husband She has under gone extensive GI track evaluation to determine vomiting episode while under the influence of anesthesia last month. The evaluation came out with clear for foot surgery.  HPI: Been having pain in back of left heel for duration of 4 months. 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 Been on pain medication since her shoulder surgery till a few weeks ago. 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   REVIEW OF SYSTEMS: A comprehensive review of systems was negative except for: Left shoulder rotator cuff surgery.  OBJECTIVE: DERMATOLOGIC EXAMINATION: Normal findings without any open lesions.  VASCULAR EXAMINATION OF LOWER LIMBS: All pedal pulses are palpable with normal pulsation.  Warm and swollen posterior heel at Achilles tendon insertion site. Temperature gradient from tibial crest to dorsum of foot is within normal bilateral.  NEUROLOGIC EXAMINATION OF THE LOWER LIMBS: All epicritic and tactile sensations grossly intact.  Extremely sensitive and painful to light pressure left posterolateral heel.  MUSCULOSKELETAL EXAMINATION: Positive for cavovarus left foot. Severe elevated first ray right. Plantar flexing right hallux with plantar medial callus.  ASSESSMENT: Tendonitis Bursitis left Achilles tendon with recurring pain and swelling. Pain left heel. Difficulty walking.   PLAN: Patient is not sure when she can have her surgery. Proposed surgery is resection of spur and abnormal bone fragment and reattach Achilles tendon on left.

## 2017-04-05 ENCOUNTER — Telehealth: Payer: Self-pay | Admitting: *Deleted

## 2017-04-05 MED ORDER — OXYCODONE-ACETAMINOPHEN 7.5-325 MG PO TABS: 1.0000 | ORAL_TABLET | Freq: Four times a day (QID) | ORAL | 0 refills | Status: DC | PRN

## 2017-04-05 NOTE — Telephone Encounter (Signed)
04/05/17 Patient called this am and requested a refill on her pain medication.

## 2017-04-30 ENCOUNTER — Ambulatory Visit (INDEPENDENT_AMBULATORY_CARE_PROVIDER_SITE_OTHER): Payer: BLUE CROSS/BLUE SHIELD | Admitting: Podiatry

## 2017-04-30 DIAGNOSIS — M7752 Other enthesopathy of left foot: Secondary | ICD-10-CM | POA: Diagnosis not present

## 2017-04-30 DIAGNOSIS — M7662 Achilles tendinitis, left leg: Secondary | ICD-10-CM

## 2017-04-30 DIAGNOSIS — M7732 Calcaneal spur, left foot: Secondary | ICD-10-CM | POA: Diagnosis not present

## 2017-04-30 MED ORDER — OXYCODONE HCL 5 MG PO TABS: 5.0000 mg | ORAL_TABLET | Freq: Four times a day (QID) | ORAL | 0 refills | Status: DC | PRN

## 2017-04-30 NOTE — Progress Notes (Signed)
SUBJECTIVE: 51 y.o.year old female presents with recurring heel pain and request for surgical option on left foot. Pain is mostly at the end of the day. Stated that she has had mold problem at home and got sick for a while. Mold problem has been resolved and she is feeling better and ready to have her left foot surgery.  HPI: Patient is cleared for GI problem by her specialist after OR incident that she vomited after been sedated. Left heel since February 2018. Been treated conservatively and failed to improve. Her surgery got cancelled after she vomit at OR table in June 2018. 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  Positive history of Left shoulder rotator cuff surgery. Positive history of severe sinus infection from household mildew problem.  OBJECTIVE: DERMATOLOGIC EXAMINATION: No open lesions.  VASCULAR EXAMINATION OF LOWER LIMBS: No abnormal findings. No swelling or increase in temperature.  NEUROLOGIC EXAMINATION OF THE LOWER LIMBS: All epicritic and tactile sensations grossly intact.  Extremely sensitive and painful to light pressure left posterolateral heel.  MUSCULOSKELETAL EXAMINATION: Cavus type foot bilateral. Elevated first ray right. Severe pain at posterior heel with palpation and range of motion left ankle joint.   ASSESSMENT: Unresolved bursitis, tendonitis left Achilles tendon insertion site. Posterior heel spur left heel. Difficulty walking.  PLAN: Reviewed clinical findings and available treatment options. Surgery consent form reviewed for removal of spur with re attachment of Achilles tendon left.  As per request, pain medication ordered.

## 2017-04-30 NOTE — Patient Instructions (Addendum)
Seen for pre op discussion. Will schedule for removal of posterior heel spur and re attachment of Achilles tendon left foot. Oxycodone 5 mg #40 prescribed as per request.

## 2017-05-01 ENCOUNTER — Encounter: Payer: Self-pay | Admitting: Podiatry

## 2017-05-16 ENCOUNTER — Telehealth: Payer: Self-pay | Admitting: *Deleted

## 2017-05-16 MED ORDER — OXYCODONE-ACETAMINOPHEN 7.5-325 MG PO TABS: 1.0000 | ORAL_TABLET | Freq: Four times a day (QID) | ORAL | 0 refills | Status: DC | PRN

## 2017-05-16 NOTE — Telephone Encounter (Signed)
05/16/17 Patient's Husband called in for patient, he states she is at the Dentist, Patient states she needs another RX for pain, the last one given to her is different he says and she request Oxcycodone with Acethmephine.

## 2017-05-24 ENCOUNTER — Other Ambulatory Visit: Payer: Self-pay | Admitting: Family Medicine

## 2017-05-24 ENCOUNTER — Other Ambulatory Visit (HOSPITAL_COMMUNITY)
Admission: RE | Admit: 2017-05-24 | Discharge: 2017-05-24 | Disposition: A | Discharge status: Home / Self Care | Payer: BLUE CROSS/BLUE SHIELD | Source: Ambulatory Visit | Attending: Family Medicine | Admitting: Family Medicine

## 2017-05-24 DIAGNOSIS — Z124 Encounter for screening for malignant neoplasm of cervix: Secondary | ICD-10-CM | POA: Diagnosis present

## 2017-05-29 LAB — CYTOLOGY - PAP: Diagnosis: NEGATIVE

## 2017-06-22 ENCOUNTER — Telehealth: Payer: Self-pay | Admitting: *Deleted

## 2017-06-22 NOTE — Telephone Encounter (Signed)
06/22/17 Dr. Raynald KempSheard, [name] is scheduled for surgery on Friday June 29 2018. Dr. Elsie Raosgrove request that you would give her a Prescription before her surgery. It is REGLAN(GENERIC:METACLOPRAMIDE). 15mg  @ noon, 6:00pm and at Bedtime the day before her surgery and at 6:00 on the day of surgery. He said he would be happy to write the RX for her if you like.

## 2017-06-25 ENCOUNTER — Other Ambulatory Visit: Payer: Self-pay | Admitting: Podiatry

## 2017-06-25 MED ORDER — METOCLOPRAMIDE HCL 5 MG/5ML PO SOLN: 15.0000 mg | Freq: Three times a day (TID) | ORAL | 0 refills | Status: DC

## 2017-06-25 MED ORDER — OXYCODONE HCL 5 MG PO TABS: 5.0000 mg | ORAL_TABLET | Freq: Four times a day (QID) | ORAL | 0 refills | Status: DC | PRN

## 2017-06-29 DIAGNOSIS — M216X2 Other acquired deformities of left foot: Secondary | ICD-10-CM | POA: Diagnosis not present

## 2017-06-29 DIAGNOSIS — M66362 Spontaneous rupture of flexor tendons, left lower leg: Secondary | ICD-10-CM | POA: Diagnosis not present

## 2017-07-04 ENCOUNTER — Encounter: Payer: Self-pay | Admitting: Podiatry

## 2017-07-04 ENCOUNTER — Ambulatory Visit (INDEPENDENT_AMBULATORY_CARE_PROVIDER_SITE_OTHER): Payer: BLUE CROSS/BLUE SHIELD | Admitting: Podiatry

## 2017-07-04 DIAGNOSIS — M7732 Calcaneal spur, left foot: Secondary | ICD-10-CM

## 2017-07-04 MED ORDER — OXYCODONE-ACETAMINOPHEN 7.5-325 MG PO TABS: 1.0000 | ORAL_TABLET | Freq: Four times a day (QID) | ORAL | 0 refills | Status: DC | PRN

## 2017-07-04 NOTE — Progress Notes (Signed)
1 week post op following heel spur resection with Achilles tendon reattachment left, 06/29/17. Using knee scooter and doing well.  Pain is tolerable and manageable.  Taking pain medication q 5-7 hours. No other complaints.

## 2017-07-04 NOTE — Patient Instructions (Signed)
5 days post op check. Doing well post operatively. Continue current level of care. Return in 4 weeks from the surgery.

## 2017-07-06 ENCOUNTER — Encounter: Payer: Self-pay | Admitting: Podiatry

## 2017-07-25 ENCOUNTER — Ambulatory Visit: Payer: BLUE CROSS/BLUE SHIELD | Admitting: Podiatry

## 2017-07-25 DIAGNOSIS — M76821 Posterior tibial tendinitis, right leg: Secondary | ICD-10-CM

## 2017-07-25 DIAGNOSIS — M7752 Other enthesopathy of left foot: Secondary | ICD-10-CM

## 2017-07-25 DIAGNOSIS — M7662 Achilles tendinitis, left leg: Secondary | ICD-10-CM

## 2017-07-25 DIAGNOSIS — M7732 Calcaneal spur, left foot: Secondary | ICD-10-CM

## 2017-07-25 NOTE — Patient Instructions (Signed)
4 weeks follow up on left heel surgery. Cast removed. Need be wrapped with Ace bandage to control swelling on foot. Use CAM walker as tolerated. Right ankle pain supported with ankle support. Return in 3 weeks.

## 2017-07-26 ENCOUNTER — Encounter: Payer: Self-pay | Admitting: Podiatry

## 2017-07-26 ENCOUNTER — Ambulatory Visit (INDEPENDENT_AMBULATORY_CARE_PROVIDER_SITE_OTHER): Payer: BLUE CROSS/BLUE SHIELD | Admitting: Podiatry

## 2017-07-26 DIAGNOSIS — M7662 Achilles tendinitis, left leg: Secondary | ICD-10-CM

## 2017-07-26 NOTE — Patient Instructions (Signed)
Seen for pain and swelling occurred last night. At this time pain and swelling subsided. The affected limb re wrapped with ACE and placed in CAM walker with instruction. Increase activity gradually and take time. Return in 3 weeks.

## 2017-07-26 NOTE — Progress Notes (Signed)
4 weeks following left Achilles tendon repair with removal of spur, 06/29/17. Been doing well using knee scooter and cast in left lower limb. Mild to minimum discomfort on left. Having problem with pain at right instep and ankle area with weight bearing. Possible Posterior tibialis tendonitis right due to excess loading. Cast removed. Left lower limb placed in CAM walker with Ace wrap with instruction. Right foot and ankle placed in ankle brace with instruction. Pain was relieved with the brace. Return in 3 weeks.

## 2017-07-26 NOTE — Progress Notes (Signed)
Patient presents walking in CAM walker accompanied by her husband. Patient called this morning stating that she was experiencing much pain and swelling since cast was removed. She was told to come in this morning. Taking Ibuprofen with good relief. Patient stated that she was having much pain and swelling on the left foot and ankle. She noted of huge swelling in her left ankle. Last night was very painful. Now the pain has subsided and able to walk without difficulty or pain. Also stated that she did stand on her feet and move around a bit more prior to gong to bed. Upon removal of Ace wrap noted of no edema or any discomfort. Reviewed findings. The pain and swelling might be from abrupt change in her activity. Left foot rewrapped with Ace and placed in CAM walker. Patient is to increase her activity as she can tolerate. Return in 3 weeks or sooner if needed.

## 2017-08-15 ENCOUNTER — Ambulatory Visit (INDEPENDENT_AMBULATORY_CARE_PROVIDER_SITE_OTHER): Payer: BLUE CROSS/BLUE SHIELD | Admitting: Podiatry

## 2017-08-15 DIAGNOSIS — M7662 Achilles tendinitis, left leg: Secondary | ICD-10-CM

## 2017-08-15 NOTE — Patient Instructions (Signed)
6 week post op. Normal wound healing noted. May use ankle brace during the day to combat swelling. Resume regular activity. Return as needed.

## 2017-08-15 NOTE — Progress Notes (Signed)
7 weeks post op following heel spur resection with Achilles tendon reattachment left, 06/29/17. Been out of boot for a week. Last week noted of swelling on medial and lateral aspect of ankle towards evening. Been soaking in Epson salt and taking Aleve. Otherwise doing well without pain during ambulation. Noted of no edema or erythema. Surgical site healed well with scab over incision site. May use Ankle brace as needed.

## 2017-08-16 ENCOUNTER — Encounter: Payer: Self-pay | Admitting: Podiatry

## 2017-10-10 ENCOUNTER — Encounter: Payer: Self-pay | Admitting: Podiatry

## 2017-10-10 ENCOUNTER — Ambulatory Visit: Payer: BLUE CROSS/BLUE SHIELD | Admitting: Podiatry

## 2017-10-10 DIAGNOSIS — M792 Neuralgia and neuritis, unspecified: Secondary | ICD-10-CM

## 2017-10-10 MED ORDER — LIDO-CAPSAICIN-MEN-METHYL SAL 4-0.025-5-20 % EX PTCH: 1.0000 | MEDICATED_PATCH | CUTANEOUS | 2 refills | Status: AC

## 2017-10-10 MED ORDER — OXYCODONE-ACETAMINOPHEN 7.5-325 MG PO TABS: 1.0000 | ORAL_TABLET | Freq: Two times a day (BID) | ORAL | 0 refills | Status: DC | PRN

## 2017-10-10 NOTE — Progress Notes (Signed)
Start having pain and swelling 2 weeks ago. Pain and swelling is on top of forefoot and back of heel left foot. Start hurting between 4 pm - 6 pm. Pain is super sensitive like before ths surgery. Possible regenerating nerve that was removed during the surgery.  No edema or erythema noted with palpation other than extreme sensitivity.   3 and a half month S/P heel spur resection with Achilles tendon reattachment left, 06/29/17.  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 Compensated foot with excess weight bearing on forefoot left with pain and swelling.  Plan: Reviewed findings and available treatment options. Advised to use Cam walker or Ankle brace as needed. Reduce weight bearing activity. May use Lidoderm patch. Pain medication prescribed. Return in one month. If pain continues will try Cortisone injection.

## 2017-10-10 NOTE — Patient Instructions (Signed)
Seen for pain and swelling on left foot. Possible regenerating nerve and compensating foot. Will try Lidocaine patch and pain medication. Use Ankle brace or walking boot as needed. Return in one month. May need to use Cortisone injection if fail to improve.

## 2017-10-29 ENCOUNTER — Ambulatory Visit: Payer: BLUE CROSS/BLUE SHIELD | Admitting: Podiatry

## 2017-10-29 ENCOUNTER — Encounter: Payer: Self-pay | Admitting: Podiatry

## 2017-10-29 DIAGNOSIS — S99911A Unspecified injury of right ankle, initial encounter: Secondary | ICD-10-CM

## 2017-10-29 DIAGNOSIS — M7662 Achilles tendinitis, left leg: Secondary | ICD-10-CM

## 2017-10-29 MED ORDER — OXYCODONE-ACETAMINOPHEN 7.5-325 MG PO TABS: 1.0000 | ORAL_TABLET | Freq: Three times a day (TID) | ORAL | 0 refills | Status: DC | PRN

## 2017-10-29 NOTE — Patient Instructions (Signed)
Seen for bilateral foot pain, injured right ankle and post op pain in left. Need complete rest to heal the injured site and left foot. Pain medication re ordered. Return as needed.

## 2017-10-29 NOTE — Progress Notes (Signed)
A couple of days after been here she went out and decorated easter egg. She fell and hurt the right foot at lateral ankle. She developed a lot of pain and swelling. Now she is getting a shooting pain and swelling along the incision site of the left foot. Today she is on her way to [place] to help her daughter who has a slip disc moving. Daily she needs to stop and elevate her foot about 6 or 7 in the evening. Last patch she was prescribed was not carried in drug store.   Objective: Normal ankle joint motion without pain. Right lateral ankle has hyperpigmentation from a recent injury, about 2 weeks ago and the left lower limb is in CAM walker.  Assessment: Injury to right ankle, sprained lateral ankle. Post op pain and swelling from increased activity left ankle.  Plan: Reviewed findings and available treatment options. Advised to stay home and rest. May use OTC Lidocaine patch as needed. May take pain pills if fail to improve with topical patch.

## 2017-11-08 DIAGNOSIS — M25512 Pain in left shoulder: Secondary | ICD-10-CM

## 2017-11-08 DIAGNOSIS — G8929 Other chronic pain: Secondary | ICD-10-CM | POA: Insufficient documentation

## 2017-11-08 DIAGNOSIS — Z9889 Other specified postprocedural states: Secondary | ICD-10-CM | POA: Insufficient documentation

## 2017-11-13 ENCOUNTER — Other Ambulatory Visit: Payer: Self-pay | Admitting: Orthopedic Surgery

## 2017-11-13 DIAGNOSIS — M25512 Pain in left shoulder: Secondary | ICD-10-CM

## 2017-11-14 ENCOUNTER — Ambulatory Visit: Payer: BLUE CROSS/BLUE SHIELD | Admitting: Podiatry

## 2017-11-23 ENCOUNTER — Ambulatory Visit
Admission: RE | Admit: 2017-11-23 | Discharge: 2017-11-23 | Disposition: A | Discharge status: Home / Self Care | Payer: BLUE CROSS/BLUE SHIELD | Source: Ambulatory Visit | Attending: Orthopedic Surgery | Admitting: Orthopedic Surgery

## 2017-11-23 DIAGNOSIS — M25512 Pain in left shoulder: Secondary | ICD-10-CM

## 2017-11-26 ENCOUNTER — Other Ambulatory Visit: Payer: Self-pay | Admitting: Student

## 2017-11-26 DIAGNOSIS — M545 Low back pain: Principal | ICD-10-CM

## 2017-11-26 DIAGNOSIS — G8929 Other chronic pain: Secondary | ICD-10-CM

## 2017-12-05 ENCOUNTER — Other Ambulatory Visit: Payer: BLUE CROSS/BLUE SHIELD

## 2018-02-05 ENCOUNTER — Ambulatory Visit: Payer: BLUE CROSS/BLUE SHIELD | Admitting: Podiatry

## 2018-02-05 DIAGNOSIS — R262 Difficulty in walking, not elsewhere classified: Secondary | ICD-10-CM

## 2018-02-05 DIAGNOSIS — M792 Neuralgia and neuritis, unspecified: Secondary | ICD-10-CM | POA: Diagnosis not present

## 2018-02-05 NOTE — Progress Notes (Signed)
Subjective: 52 y.o. year old female patient presents complaining of pain in left posterior heel. Pain is sharp and shooting pain. Patient points distal lateral aspect of left heel being the source of pain.  Objective: Dermatologic: Normal findings. Vascular: Pedal pulses are all palpable. Orthopedic: No acute changes noted. Neurologic: Sharp shooting pain along the cutaneous branch of Sural nerve at distal lateral ankle joint, posterior to lateral malleoli.  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  Treatment: Reviewed findings and available treatment options, injection cortisone or sclerosing agent, neurectomy... As per discussion left distal lateral sural nerve area Injected with mixture of 4 mg Dexamethasone, 4 mg Triamcinolone, and 1 cc of 0.5% Marcaine plain. Patient tolerated well without difficulty.  Patient stated that the pain has been resolved with the injection. Return in 3 weeks to investigate for further options.

## 2018-02-05 NOTE — Patient Instructions (Signed)
Seen for left lateral heel pain. Possible neuralgia of cutaneous branch of Sural nerve. Cortisone injection given. Return in 3 weeks.

## 2018-02-06 ENCOUNTER — Encounter: Payer: Self-pay | Admitting: Podiatry

## 2018-02-26 ENCOUNTER — Ambulatory Visit: Payer: BLUE CROSS/BLUE SHIELD | Admitting: Podiatry

## 2018-02-26 DIAGNOSIS — M792 Neuralgia and neuritis, unspecified: Secondary | ICD-10-CM | POA: Diagnosis not present

## 2018-02-26 DIAGNOSIS — R262 Difficulty in walking, not elsewhere classified: Secondary | ICD-10-CM | POA: Diagnosis not present

## 2018-02-26 MED ORDER — HYDROCODONE-ACETAMINOPHEN 10-325 MG PO TABS: 1.0000 | ORAL_TABLET | Freq: Four times a day (QID) | ORAL | 0 refills | Status: AC | PRN

## 2018-02-26 NOTE — Patient Instructions (Signed)
Pain in left heel. Cortisone injection given to left posterolateral aspect. Pain medication prescribed. May return later this week if needed a repeat injection.

## 2018-02-27 ENCOUNTER — Encounter: Payer: Self-pay | Admitting: Podiatry

## 2018-02-27 NOTE — Progress Notes (Signed)
Subjective: 52 y.o. year old female patient presents stating that the last injection lasted for about 2 days. She is in process of cleaning and doing her yard work for up coming household event (daughter's baby shower). Patient request some relief of pain so that she can finish her work.  HPI: Heel spur resection with Achilles tendon reattachment left, 06/29/17.  Objective: Dermatologic: Normal findings. Vascular: Pedal pulses are all palpable. No edema or erythema noted at affected left heel. Orthopedic: No change since last visit. Neurologic: Sharp shooting pain along the cutaneous branch of Sural nerve at distal lateral and just posterior to the lateral malleoli area.  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  Treatment: As per request left posterior lateral heel injected with mixture of 4 mg Dexamethasone, 4 mg Triamcinolone, and 1 cc of 0.5% Marcaine plain. Patient tolerated well without difficulty.  Patient was advised to stay in CAM walking boot while doing her yard work.  Pain medication prescribed. May return for repeat injection.

## 2018-02-28 ENCOUNTER — Ambulatory Visit: Payer: BLUE CROSS/BLUE SHIELD | Admitting: Podiatry

## 2019-02-08 IMAGING — CR DG CHEST 2V
2 series · 2 of 2 positions shown · non-contrast
Comparison: Chest x-ray 12/25/2015

CLINICAL DATA: Patient aspirated during surgery today.

EXAM:
CHEST  2 VIEW

[w chest pa]
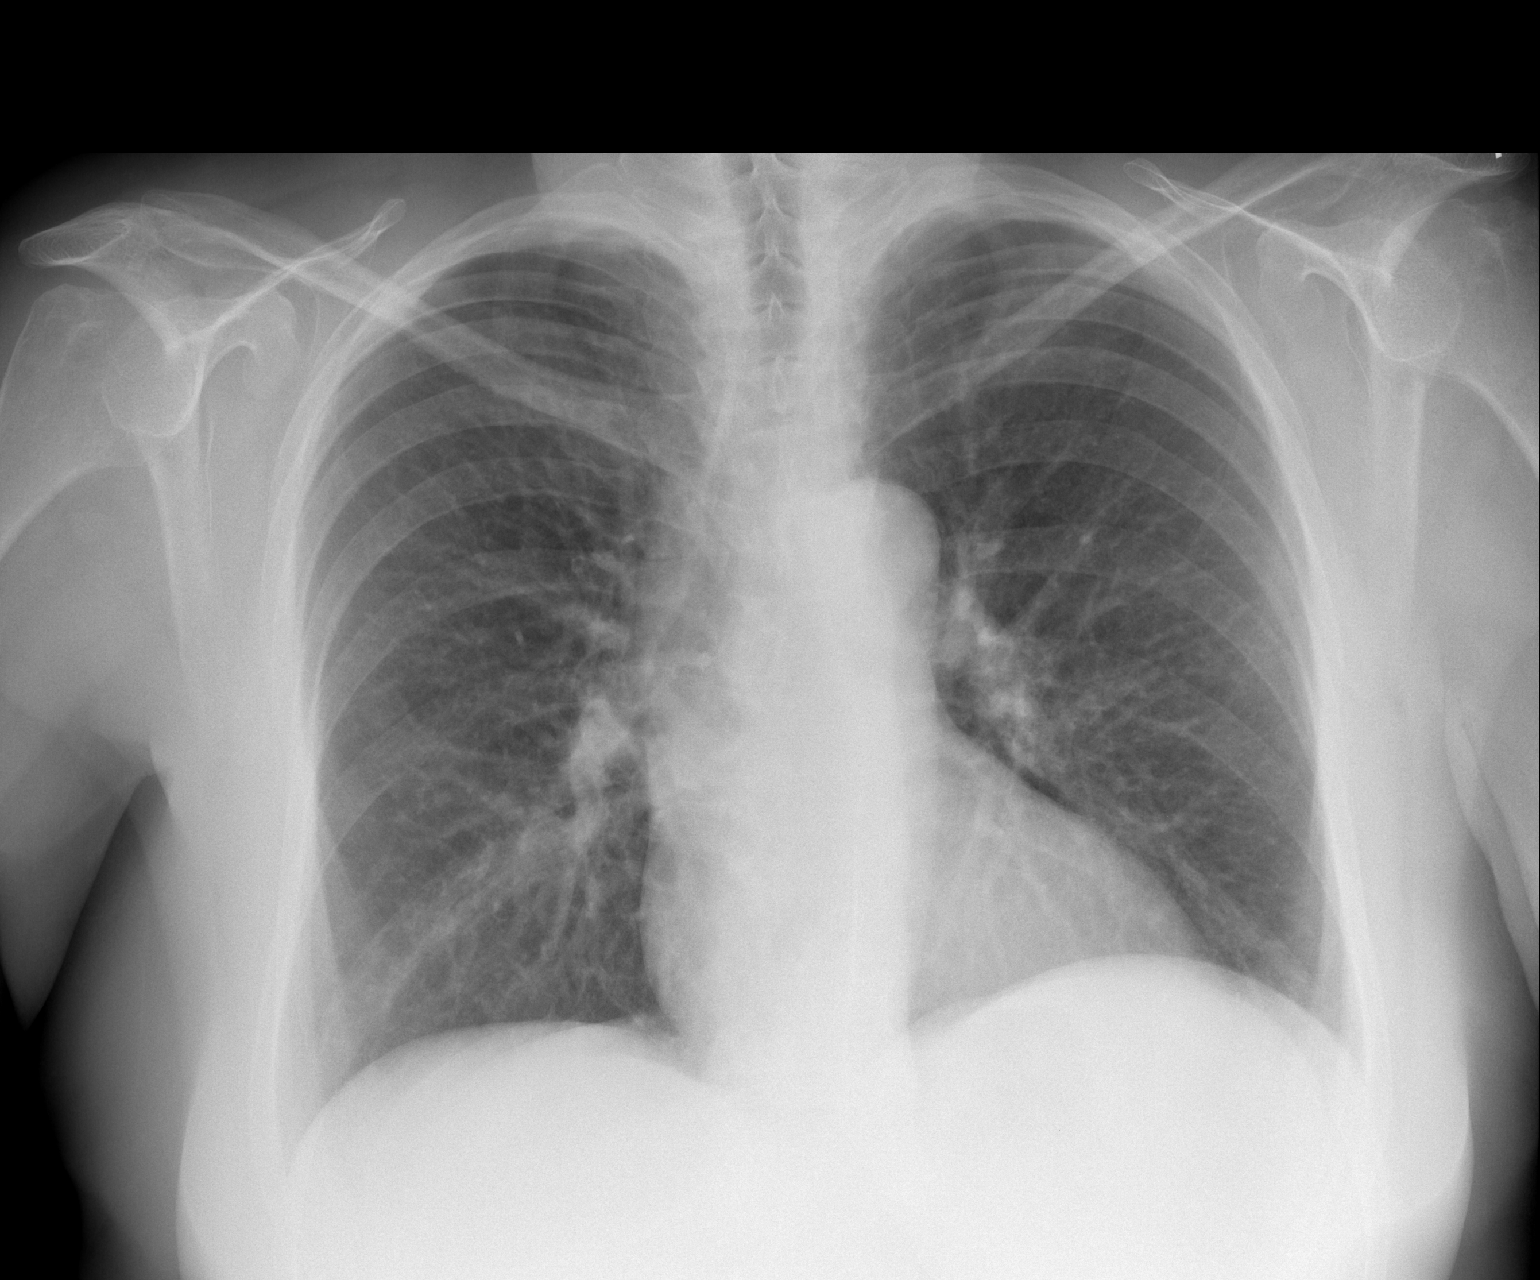

[w chest lat]
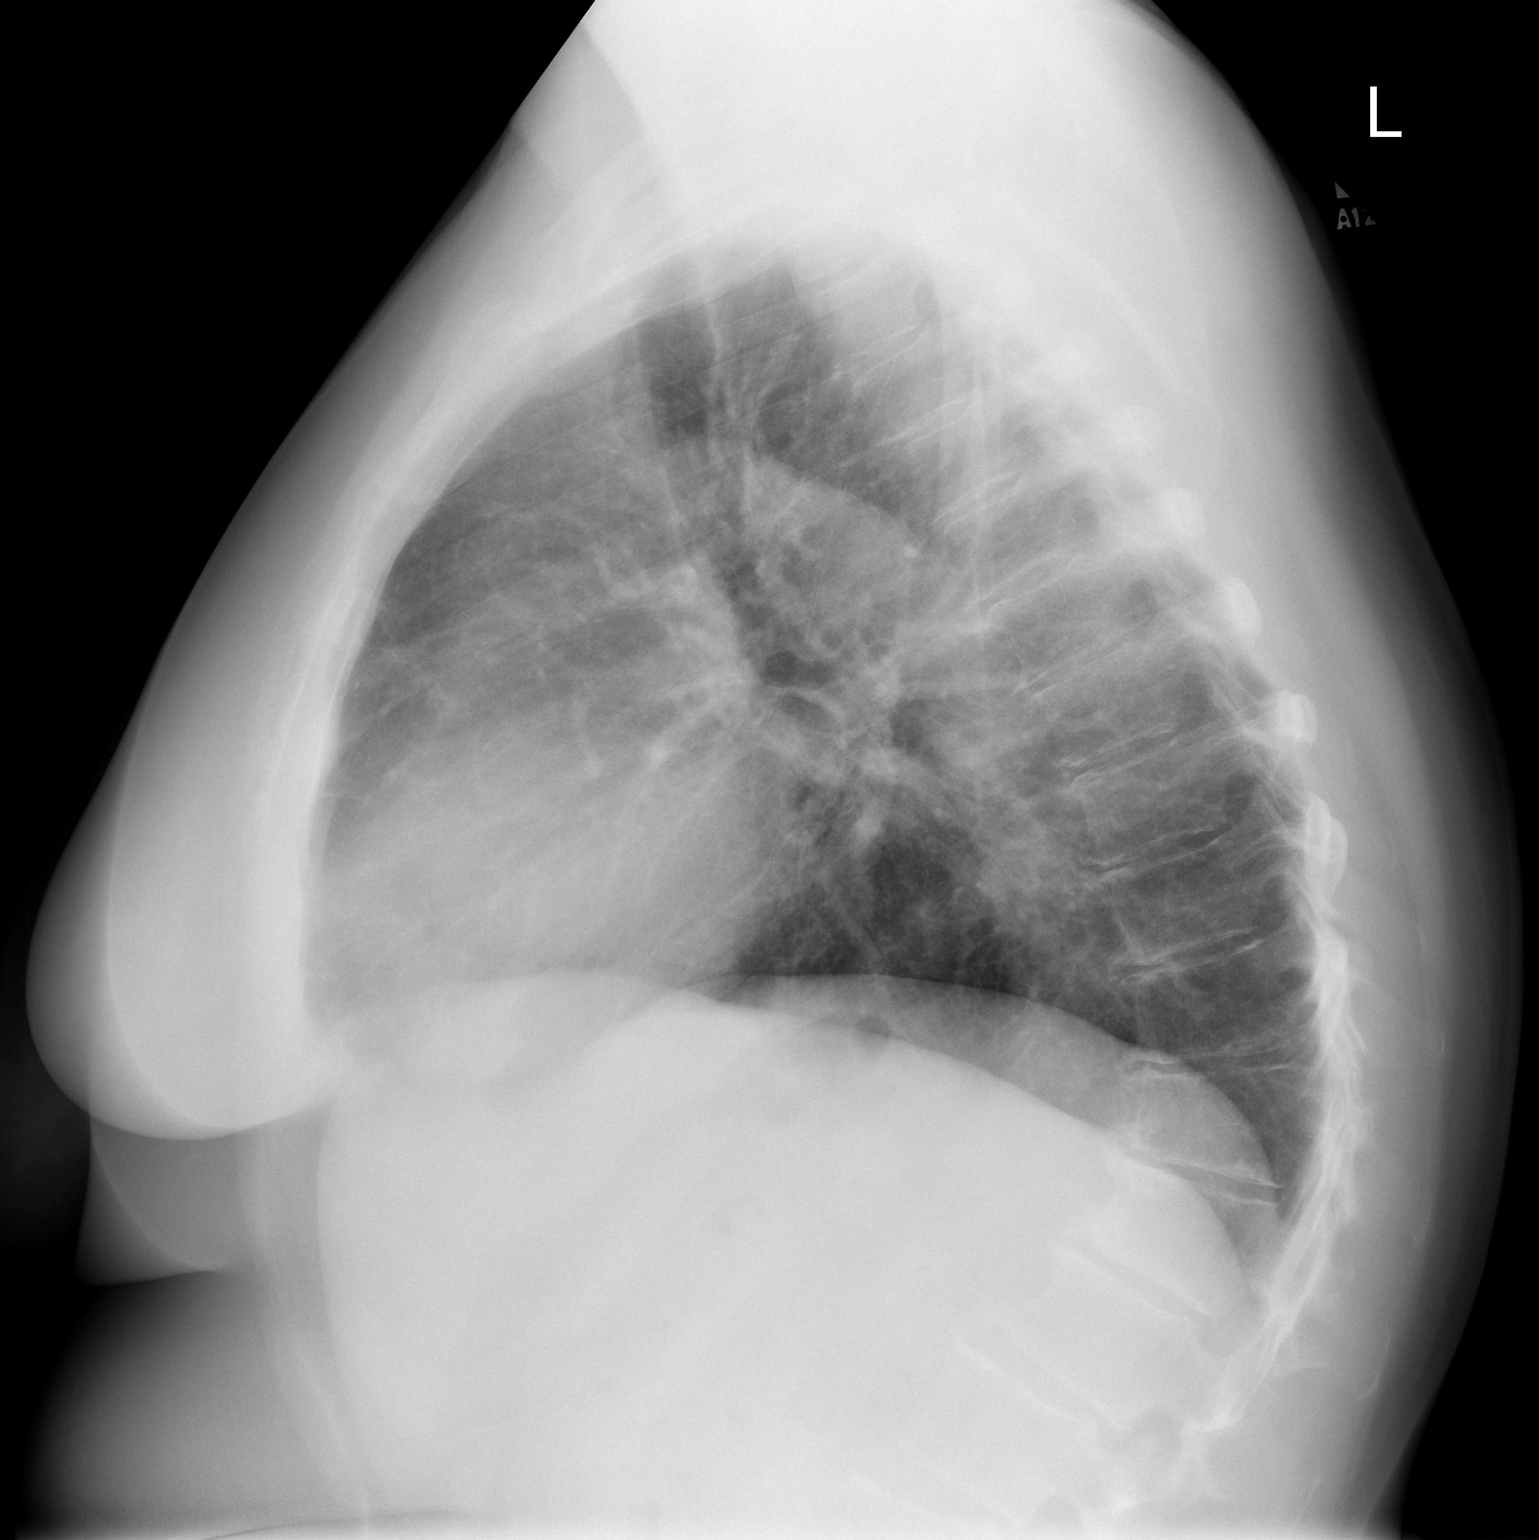

[2 of 2 positions shown; findings below may reference images not displayed]

FINDINGS: The cardiac silhouette, mediastinal and hilar contours are within
normal limits and stable. Minimal streaky subsegmental atelectasis
in the right lower lobe. No infiltrates or effusions. No pulmonary
edema. The bony thorax is intact. Mild lower thoracic vertebral body
compression deformities with a slightly exaggerated kyphosis.
IMPRESSION: Minimal streaky right basilar atelectasis but no infiltrates or
effusions.

## 2019-03-06 IMAGING — NM NM GASTRIC EMPTYING
3 series · 3 of 3 positions shown · non-contrast
Comparison: None.

CLINICAL DATA: Projectile vomiting without nausea. Recent
aspiration.

EXAM:
NUCLEAR MEDICINE GASTRIC EMPTYING SCAN
TECHNIQUE: After oral ingestion of radiolabeled meal, sequential abdominal
images were obtained for 120 minutes. Residual percentage of
activity remaining within the stomach was calculated at 60 and 120
minutes.
RADIOPHARMACEUTICALS:  2.0 mCi Cc-NNm sulfur colloid in standardized
meal

[Series 1: 0 min · 4.14mm/px · 1 of 1 slices shown]
[im 1/1]
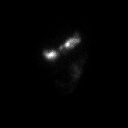

[Series 2: 1 hr · 4.14mm/px · 1 of 1 slices shown]
[im 1/1]
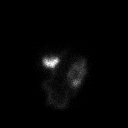

[Series 3: 2 hr · 4.14mm/px · 1 of 1 slices shown]
[im 1/1]
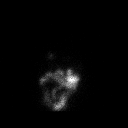

[3 of 3 positions shown; findings below may reference images not displayed]

FINDINGS: Expected location of the stomach in the left upper quadrant.
Ingested meal empties the stomach gradually over the course of the
study with 58% retention at 60 min and 7% retention at 120 min
(normal retention less than 30% at a 120 min).
IMPRESSION: Normal gastric emptying study.

## 2019-03-26 ENCOUNTER — Other Ambulatory Visit: Payer: Self-pay | Admitting: Gastroenterology

## 2019-03-26 DIAGNOSIS — R1084 Generalized abdominal pain: Secondary | ICD-10-CM

## 2019-04-03 ENCOUNTER — Other Ambulatory Visit: Payer: BLUE CROSS/BLUE SHIELD

## 2019-04-11 ENCOUNTER — Other Ambulatory Visit: Payer: Self-pay

## 2019-04-11 DIAGNOSIS — Z20822 Contact with and (suspected) exposure to covid-19: Secondary | ICD-10-CM

## 2019-04-12 LAB — NOVEL CORONAVIRUS, NAA: SARS-CoV-2, NAA: DETECTED — AB

## 2019-10-20 ENCOUNTER — Ambulatory Visit: Payer: Self-pay | Attending: Internal Medicine

## 2019-10-20 DIAGNOSIS — Z23 Encounter for immunization: Secondary | ICD-10-CM

## 2019-10-20 NOTE — Progress Notes (Signed)
   Covid-19 Vaccination Clinic  Name:  Amber Mcmahon    MRN: [id number] DOB: 11-07-1965  10/20/2019  [name] was observed post Covid-19 immunization for 30 minutes based on pre-vaccination screening without incident. She was provided with Vaccine Information Sheet and instruction to access the V-Safe system.   [name] was instructed to call 911 with any severe reactions post vaccine: Marland Kitchen Difficulty breathing  . Swelling of face and throat  . A fast heartbeat  . A bad rash all over body  . Dizziness and weakness   Immunizations Administered    Name Date Dose VIS Date Route   Pfizer COVID-19 Vaccine 10/20/2019  3:05 PM 0.3 mL 07/04/2019 Intramuscular   Manufacturer: [employer], [employer]   Lot: [id number]   NDC: [address]-3

## 2019-11-12 ENCOUNTER — Ambulatory Visit: Payer: Self-pay | Attending: Internal Medicine

## 2019-11-12 DIAGNOSIS — Z23 Encounter for immunization: Secondary | ICD-10-CM

## 2019-11-12 NOTE — Progress Notes (Signed)
   Covid-19 Vaccination Clinic  Name:  Amber Mcmahon    MRN: [id number] DOB: [birth year]  11/12/2019  [name] was observed post Covid-19 immunization for 15 minutes without incident. She was provided with Vaccine Information Sheet and instruction to access the V-Safe system.   [name] was instructed to call 911 with any severe reactions post vaccine: Marland Kitchen Difficulty breathing  . Swelling of face and throat  . A fast heartbeat  . A bad rash all over body  . Dizziness and weakness   Immunizations Administered    Name Date Dose VIS Date Route   Pfizer COVID-19 Vaccine 11/12/2019 10:15 AM 0.3 mL 09/17/2018 Intramuscular   Manufacturer: [employer], [employer]   Lot: [id number]   NDC: [address]-7

## 2020-11-08 ENCOUNTER — Other Ambulatory Visit: Payer: Self-pay | Admitting: Podiatry

## 2020-11-08 DIAGNOSIS — S86001A Unspecified injury of right Achilles tendon, initial encounter: Secondary | ICD-10-CM

## 2020-11-19 ENCOUNTER — Ambulatory Visit
Admission: RE | Admit: 2020-11-19 | Discharge: 2020-11-19 | Disposition: A | Discharge status: Home / Self Care | Payer: Managed Care, Other (non HMO) | Source: Ambulatory Visit | Attending: Podiatry | Admitting: Podiatry

## 2020-11-19 ENCOUNTER — Other Ambulatory Visit: Payer: Self-pay

## 2020-11-19 DIAGNOSIS — S86001A Unspecified injury of right Achilles tendon, initial encounter: Secondary | ICD-10-CM

## 2022-11-09 ENCOUNTER — Other Ambulatory Visit: Payer: Self-pay | Admitting: Physician Assistant

## 2022-11-09 ENCOUNTER — Ambulatory Visit
Admission: RE | Admit: 2022-11-09 | Discharge: 2022-11-09 | Disposition: A | Discharge status: Home / Self Care | Payer: Managed Care, Other (non HMO) | Source: Ambulatory Visit | Attending: Physician Assistant | Admitting: Physician Assistant

## 2022-11-09 DIAGNOSIS — R079 Chest pain, unspecified: Secondary | ICD-10-CM

## 2022-11-22 ENCOUNTER — Other Ambulatory Visit: Payer: Self-pay

## 2022-11-22 ENCOUNTER — Emergency Department (HOSPITAL_BASED_OUTPATIENT_CLINIC_OR_DEPARTMENT_OTHER): Payer: Managed Care, Other (non HMO) | Admitting: Radiology

## 2022-11-22 ENCOUNTER — Encounter (HOSPITAL_BASED_OUTPATIENT_CLINIC_OR_DEPARTMENT_OTHER): Payer: Self-pay

## 2022-11-22 ENCOUNTER — Emergency Department (HOSPITAL_BASED_OUTPATIENT_CLINIC_OR_DEPARTMENT_OTHER)
Admission: EM | Admit: 2022-11-22 | Discharge: 2022-11-22 | Disposition: A | Discharge status: Home / Self Care | Payer: Managed Care, Other (non HMO) | Attending: Emergency Medicine | Admitting: Emergency Medicine

## 2022-11-22 DIAGNOSIS — R079 Chest pain, unspecified: Secondary | ICD-10-CM | POA: Diagnosis present

## 2022-11-22 DIAGNOSIS — I10 Essential (primary) hypertension: Secondary | ICD-10-CM | POA: Diagnosis not present

## 2022-11-22 DIAGNOSIS — Z87891 Personal history of nicotine dependence: Secondary | ICD-10-CM | POA: Diagnosis not present

## 2022-11-22 DIAGNOSIS — T148XXA Other injury of unspecified body region, initial encounter: Secondary | ICD-10-CM

## 2022-11-22 DIAGNOSIS — Z79899 Other long term (current) drug therapy: Secondary | ICD-10-CM | POA: Insufficient documentation

## 2022-11-22 LAB — CBC
HCT: 43.2 % (ref 36.0–46.0)
Hemoglobin: 14.4 g/dL (ref 12.0–15.0)
MCH: 32.2 pg (ref 26.0–34.0)
MCHC: 33.3 g/dL (ref 30.0–36.0)
MCV: 96.6 fL (ref 80.0–100.0)
Platelets: 267 10*3/uL (ref 150–400)
RBC: 4.47 MIL/uL (ref 3.87–5.11)
RDW: 13.5 % (ref 11.5–15.5)
WBC: 7.6 10*3/uL (ref 4.0–10.5)
nRBC: 0 % (ref 0.0–0.2)

## 2022-11-22 LAB — TROPONIN I (HIGH SENSITIVITY)
Troponin I (High Sensitivity): 6 ng/L (ref <18)
Troponin I (High Sensitivity): 7 ng/L (ref <18)

## 2022-11-22 LAB — BASIC METABOLIC PANEL
Anion gap: 10 (ref 5–15)
BUN: 12 mg/dL (ref 6–20)
CO2: 26 mmol/L (ref 22–32)
Calcium: 9.7 mg/dL (ref 8.9–10.3)
Chloride: 99 mmol/L (ref 98–111)
Creatinine, Ser: 0.81 mg/dL (ref 0.44–1.00)
GFR, Estimated: >60 mL/min (ref >60)
Glucose, Bld: 115 mg/dL — ABNORMAL HIGH (ref 70–99)
Potassium: 3.8 mmol/L (ref 3.5–5.1)
Sodium: 135 mmol/L (ref 135–145)

## 2022-11-22 LAB — D-DIMER, QUANTITATIVE: D-Dimer, Quant: 0.44 ug/mL-FEU (ref 0.00–0.50)

## 2022-11-22 MED ORDER — LIDOCAINE 5 % EX PTCH: 1.0000 | MEDICATED_PATCH | CUTANEOUS | 0 refills | Status: AC

## 2022-11-22 MED ORDER — CYCLOBENZAPRINE HCL 5 MG PO TABS
5.0000 mg | ORAL_TABLET | Freq: Once | ORAL | Status: AC
  Administered 2022-11-22: 5 mg via ORAL
  Filled 2022-11-22: qty 1

## 2022-11-22 MED ORDER — CYCLOBENZAPRINE HCL 5 MG PO TABS: 10.0000 mg | ORAL_TABLET | Freq: Two times a day (BID) | ORAL | 0 refills | Status: AC | PRN

## 2022-11-22 NOTE — ED Triage Notes (Signed)
Patient here POV from Cardiologist.  Endorses CP intermittently for months. Went to Cardiologist for Evaluation and was sent for active CP. No SOB.  NAD Noted during Triage. A&Ox4. CGS 15. Ambulatory.

## 2022-11-22 NOTE — ED Notes (Signed)
RN reviewed discharge instructions with pt. Pt verbalized understanding and had no further questions. VSS upon discharge.  

## 2022-11-22 NOTE — ED Provider Notes (Signed)
[facility] EMERGENCY DEPARTMENT AT [facility] Provider Note   CSN: [id number] Arrival date & time: 11/22/22  1112     History  Chief Complaint  Patient presents with   Chest Pain    Amber Mcmahon is a 57 y.o. female, history of hypertension, tobacco use, who presents to the ED secondary to intermittent chest pain for the last 7 weeks.  She states that she has been having some chest pain that is stabbing on the left breast, worse when she takes a deep breath, and when she moves her arm.  She states that she saw her cardiologist today, and he thought this sent her here to rule out any acute cardiac issue.  And also ordered some outpatient heart scans.  She has never had any kind of heart issues before she states, and denies any trauma, or swelling of the bilateral legs.  Is not having any kind of nausea, vomiting, abdominal pain, does endorse some shortness of breath, but she states that this can be normal for her.  Continues to smoke half pack per day.  States that she feels like it is worse when she twists at the side, or lift her arm up.    Home Medications Prior to Admission medications   Medication Sig Start Date End Date Taking? Authorizing Provider  cyclobenzaprine (FLEXERIL) 5 MG tablet Take 2 tablets (10 mg total) by mouth 2 (two) times daily as needed for muscle spasms. 11/22/22  Yes Michaelann Gunnoe L, PA  lidocaine (LIDODERM) 5 % Place 1 patch onto the skin daily. Remove & Discard patch within 12 hours or as directed by MD 11/22/22  Yes Navid Lenzen L, PA  ADZENYS XR-ODT 18.8 MG TBED  09/21/16   [provider]  ADZENYS XR-ODT 9.4 MG TBED  09/21/16   [provider]  HYDROcodone-acetaminophen (NORCO) 10-325 MG tablet Take 1 tablet by mouth every 6 (six) hours as needed. 02/26/18   Sheard, Myeong O, DPM  pantoprazole (PROTONIX) 40 MG tablet Take 40 mg by mouth daily.    [provider]      Allergies    Ciprofloxacin    Review of Systems   Review  of Systems  Constitutional:  Negative for fever.  Respiratory:  Positive for shortness of breath.   Cardiovascular:  Positive for chest pain.    Physical Exam Updated Vital Signs BP (!) 164/91   Pulse (!) 58   Temp 97.6 F (36.4 C) (Temporal)   Resp 11   Ht 5\' 6"  (1.676 m)   Wt 99.8 kg   SpO2 96%   BMI 35.51 kg/m  Physical Exam Vitals and nursing note reviewed.  Constitutional:      General: She is not in acute distress.    Appearance: She is well-developed.  HENT:     Head: Normocephalic and atraumatic.  Eyes:     Conjunctiva/sclera: Conjunctivae normal.  Cardiovascular:     Rate and Rhythm: Normal rate and regular rhythm.     Heart sounds: No murmur heard. Pulmonary:     Effort: Pulmonary effort is normal. No respiratory distress.     Breath sounds: Normal breath sounds.  Chest:     Comments: +TTP of lateral aspect of left chest w/o rash, edema, or warmth.  Abdominal:     Palpations: Abdomen is soft.     Tenderness: There is no abdominal tenderness.  Musculoskeletal:        General: No swelling.     Cervical back: Neck  supple.     Comments: Chest pain with abduction of L arm.   Skin:    General: Skin is warm and dry.     Capillary Refill: Capillary refill takes less than 2 seconds.  Neurological:     Mental Status: She is alert.  Psychiatric:        Mood and Affect: Mood normal.     ED Results / Procedures / Treatments   Labs (all labs ordered are listed, but only abnormal results are displayed) Labs Reviewed  BASIC METABOLIC PANEL - Abnormal; Notable for the following components:      Result Value   Glucose, Bld 115 (*)    All other components within normal limits  CBC  D-DIMER, QUANTITATIVE  TROPONIN I (HIGH SENSITIVITY)  TROPONIN I (HIGH SENSITIVITY)    EKG EKG Interpretation  Date/Time:  Wednesday Nov 22 2022 11:22:48 EDT Ventricular Rate:  58 PR Interval:  116 QRS Duration: 76 QT Interval:  426 QTC Calculation: 418 R Axis:   76 Text  Interpretation: Sinus bradycardia Otherwise normal ECG When compared with ECG of 22-May-2016 14:34, No significant change was found agree Confirmed by Arby Barrette [phone]) on 11/22/2022 11:41:07 AM  Radiology DG Chest 2 View  Result Date: 11/22/2022 CLINICAL DATA:  Chest pain. EXAM: CHEST - 2 VIEW COMPARISON:  11/09/2022. FINDINGS: Clear lungs. Stable cardiac and mediastinal contours. No pleural effusion or pneumothorax. Unchanged mild anterior height loss of multiple lower thoracic vertebral bodies contributing to accentuation of the lower thoracic kyphosis. IMPRESSION: No evidence of acute cardiopulmonary disease. Electronically Signed   By: Orvan Falconer M.D.   On: 11/22/2022 11:53    Procedures Procedures    Medications Ordered in ED Medications  cyclobenzaprine (FLEXERIL) tablet 5 mg (5 mg Oral Given 11/22/22 1517)    ED Course/ Medical Decision Making/ A&P             HEART Score: 4                Medical Decision Making Patient is a 57 year old female, here for shortness of breath, chest pain, that is going going into intermittently on for the last 7 weeks.  She states it is worse when she lifts her hand up, or when she turns.  Or takes a deep breath.  We will obtain D-dimer, blood work, including troponins for further evaluation of etiology of chest pain she was sent by cardiology for atypical chest pain, to rule out current MI, with outpatient plans for stress test per [facility] reports.  She is tender to palpation of the left lateral chest wall,/breast.  No wounds however.  And it is worse when she lifts her arm up, versus turns at her side.  I am suspicious for musculoskeletal etiology.  Amount and/or Complexity of Data Reviewed Labs: ordered.    Details: D-dimer less than 0.5, troponin within normal limits. Radiology: ordered.    Details: Chest x-ray clear ECG/medicine tests:  Decision-making details documented in ED Course. Discussion of management or test interpretation with  external provider(s): Discussed with patient she has a heart score of 4, has close follow-up with your cardiologist, including outpatient Lexiscan.  She does have tenderness to palpation of the left breast, and her D-dimer, troponins were within normal limits.  I am suspicious this is likely musculoskeletal in etiology.  She states improved after given 1 Flexeril, I prescribed her Flexeril, and lidocaine patches with strict return precautions.  She voiced understanding, was well-appearing on discharge.  Risk Prescription  drug management.    Final Clinical Impression(s) / ED Diagnoses Final diagnoses:  Chest pain, unspecified type  Muscle strain    Rx / DC Orders ED Discharge Orders          Ordered    lidocaine (LIDODERM) 5 %  Every 24 hours        11/22/22 1545    cyclobenzaprine (FLEXERIL) 5 MG tablet  2 times daily PRN        11/22/22 1545              Cyndal Kasson, [place], [place] 11/22/22 1830    Maia Plan, MD 11/23/22 1515

## 2022-11-22 NOTE — Discharge Instructions (Addendum)
Your heart enzymes are reassuring today, and there is less than a 2% chance of a blood clot.  Please follow-up with your cardiologist, and get your outpatient exams.  Return to the ER if you have worsening chest pain, shortness of breath.  I have sent you some muscle relaxers, and some lidocaine patches to help with this.  I am suspicious that your pain may be from a muscle strain as it is worse with movement.  Return to ER if you have worsening chest pain, shortness of breath.

## 2022-11-22 NOTE — ED Notes (Signed)
Pt starting having stabbing pain in left shoulder down to breast while I was in the room, performed EKG and showed NSR

## 2023-06-19 ENCOUNTER — Other Ambulatory Visit: Payer: Self-pay

## 2023-06-19 ENCOUNTER — Emergency Department (HOSPITAL_COMMUNITY)
Admission: EM | Admit: 2023-06-19 | Discharge: 2023-06-20 | Disposition: A | Discharge status: Home / Self Care | Payer: Managed Care, Other (non HMO) | Attending: Emergency Medicine | Admitting: Emergency Medicine

## 2023-06-19 ENCOUNTER — Encounter (HOSPITAL_COMMUNITY): Payer: Self-pay | Admitting: Emergency Medicine

## 2023-06-19 DIAGNOSIS — R55 Syncope and collapse: Secondary | ICD-10-CM | POA: Diagnosis present

## 2023-06-19 DIAGNOSIS — F17200 Nicotine dependence, unspecified, uncomplicated: Secondary | ICD-10-CM | POA: Insufficient documentation

## 2023-06-19 DIAGNOSIS — N3000 Acute cystitis without hematuria: Secondary | ICD-10-CM | POA: Diagnosis not present

## 2023-06-19 NOTE — ED Triage Notes (Signed)
Pt in from home via [facility] with seizure-like activity. EMS states husband reports pt became dizzy, nauseous and diaphoretic and helped herself to sitting on the stairs. He states she then had emesis x 2 and arms were clenched, +urination; lasted a few min. Pt remembers entire event. No hx of seizures  VS enroute: 128/71 57HR 98%RA CBG 152

## 2023-06-19 NOTE — ED Provider Triage Note (Signed)
Emergency Medicine Provider Triage Evaluation Note  Amber Mcmahon , a 58 y.o. female  was evaluated in triage.  Pt complains of near syncopal event.  States has been happening a lot recently but worse today.  Got hot, diaphoretic and felt very lightheaded but able to sit herself down onto the stairs.  She remained AAO, recalls the event.  She did vomit x2 and urinated on herself a little while vomiting but no true incontinence.  Review of Systems  Positive: syncope Negative: Chest pain, SOB  Physical Exam  BP 126/78 (BP Location: Right Arm)   Pulse 62   Temp 98 F (36.7 C) (Oral)   Resp 17   Wt 99.8 kg   SpO2 100%   BMI 35.51 kg/m  Gen:   Awake, no distress   Resp:  Normal effort  MSK:   Moves extremities without difficulty  Other:  AAOX3, able to answer questions and follow commands, no head trauma  Medical Decision Making  Medically screening exam initiated at 11:45 PM.  Appropriate orders placed.  CLAYTON SCHIFFMAN was informed that the remainder of the evaluation will be completed by another provider, this initial triage assessment does not replace that evaluation, and the importance of remaining in the ED until their evaluation is complete.  Near syncope.  No LOC.  Does not sound like actual seizure.  Recurrent events recently of same. VSS.  EKG, labs, CXR.   Garlon Hatchet, PA-C 06/19/23 [phone]

## 2023-06-20 ENCOUNTER — Emergency Department (HOSPITAL_COMMUNITY): Payer: Managed Care, Other (non HMO)

## 2023-06-20 LAB — BASIC METABOLIC PANEL
Anion gap: 10 (ref 5–15)
BUN: 14 mg/dL (ref 6–20)
CO2: 23 mmol/L (ref 22–32)
Calcium: 9.3 mg/dL (ref 8.9–10.3)
Chloride: 100 mmol/L (ref 98–111)
Creatinine, Ser: 0.84 mg/dL (ref 0.44–1.00)
GFR, Estimated: >60 mL/min (ref >60)
Glucose, Bld: 148 mg/dL — ABNORMAL HIGH (ref 70–99)
Potassium: 3.9 mmol/L (ref 3.5–5.1)
Sodium: 133 mmol/L — ABNORMAL LOW (ref 135–145)

## 2023-06-20 LAB — URINALYSIS, ROUTINE W REFLEX MICROSCOPIC
Bilirubin Urine: NEGATIVE
Glucose, UA: NEGATIVE mg/dL
Ketones, ur: NEGATIVE mg/dL
Nitrite: NEGATIVE
Protein, ur: 30 mg/dL — AB
RBC / HPF: >50 RBC/hpf (ref 0–5)
Specific Gravity, Urine: 1.019 (ref 1.005–1.030)
Squamous Epithelial / HPF: >50 /[HPF] (ref 0–5)
WBC, UA: >50 WBC/hpf (ref 0–5)
pH: 5 (ref 5.0–8.0)

## 2023-06-20 LAB — CBC
HCT: 42.8 % (ref 36.0–46.0)
Hemoglobin: 14.3 g/dL (ref 12.0–15.0)
MCH: 32 pg (ref 26.0–34.0)
MCHC: 33.4 g/dL (ref 30.0–36.0)
MCV: 95.7 fL (ref 80.0–100.0)
Platelets: 290 10*3/uL (ref 150–400)
RBC: 4.47 MIL/uL (ref 3.87–5.11)
RDW: 13.8 % (ref 11.5–15.5)
WBC: 9.8 10*3/uL (ref 4.0–10.5)
nRBC: 0 % (ref 0.0–0.2)

## 2023-06-20 LAB — CBG MONITORING, ED: Glucose-Capillary: 123 mg/dL — ABNORMAL HIGH (ref 70–99)

## 2023-06-20 LAB — TROPONIN I (HIGH SENSITIVITY)
Troponin I (High Sensitivity): 6 ng/L (ref <18)
Troponin I (High Sensitivity): 5 ng/L (ref <18)

## 2023-06-20 MED ORDER — NITROFURANTOIN MONOHYD MACRO 100 MG PO CAPS: 100.0000 mg | ORAL_CAPSULE | Freq: Two times a day (BID) | ORAL | 0 refills | Status: AC

## 2023-06-20 MED ORDER — NITROFURANTOIN MONOHYD MACRO 100 MG PO CAPS: 100.0000 mg | ORAL_CAPSULE | Freq: Two times a day (BID) | ORAL | 0 refills | Status: DC

## 2023-06-20 NOTE — Discharge Instructions (Signed)
Take nitrofurantoin 100 mg 2 times a day for the next 5 days.  This medication will help treat your UTI.  Common side effects that you may experience are nausea, vomiting, diarrhea, stomach pain, headache/dizziness.  Good way to avoid these is to take with food.  Be sure to drink plenty of fluids while on this medication, avoid alcohol and avoid antiacids as this can reduce the effectiveness of the antibiotic.  Although very rare, if you are experiencing difficulty breathing, fever, chills, yellowing of eyes, rash return to the ER for further evaluation  Return to the ER if you are experiencing shortness of breath, chest pain, fever, worsening symptoms.

## 2023-06-20 NOTE — ED Provider Notes (Signed)
[place] EMERGENCY DEPARTMENT AT [facility] Provider Note   CSN: [id number] Arrival date & time: 06/19/23  2315     History  Chief Complaint  Patient presents with   Near Syncope    Amber Mcmahon is a 57 y.o. female.  The history is provided by the patient and the spouse.  Near Syncope  Patient is a 57 year old female presents to the ED today complaining of near syncope.  This happened around 10:00 last night after she was drinking wine and smoking outside.  She began to feel lightheaded, nauseous, sweaty where she went inside and began to throw up.  She had 2 episodes of emesis and with the last 1 felt extremely lightheaded."  States she never fell, no LOC, no trauma.  EMS reported that she urinated herself and might have had a potential seizure.  However she says that she did not have a seizure and that she simply has a really weak pelvic floor that when she was vomiting she urinated herself.  On her way to the hospital she said that she started to feel about "90% better".  Now she says she is never felt better and is only wondering if she can go home.     Home Medications Prior to Admission medications   Medication Sig Start Date End Date Taking? Authorizing Provider  nitrofurantoin, macrocrystal-monohydrate, (MACROBID) 100 MG capsule Take 1 capsule (100 mg total) by mouth 2 (two) times daily for 5 days. 06/20/23 06/25/23 Yes Lunette Stands, PA-C  ADZENYS XR-ODT 18.8 MG TBED  09/21/16   [provider]  ADZENYS XR-ODT 9.4 MG TBED  09/21/16   [provider]  cyclobenzaprine (FLEXERIL) 5 MG tablet Take 2 tablets (10 mg total) by mouth 2 (two) times daily as needed for muscle spasms. 11/22/22   Small, Brooke L, PA  HYDROcodone-acetaminophen (NORCO) 10-325 MG tablet Take 1 tablet by mouth every 6 (six) hours as needed. 02/26/18   Sheard, Myeong O, DPM  lidocaine (LIDODERM) 5 % Place 1 patch onto the skin daily. Remove & Discard patch within 12 hours or as  directed by MD 11/22/22   Small, Brooke L, PA  pantoprazole (PROTONIX) 40 MG tablet Take 40 mg by mouth daily.    [provider]      Allergies    Ciprofloxacin    Review of Systems   Review of Systems  Constitutional:  Positive for diaphoresis.  Cardiovascular:  Positive for near-syncope.  Gastrointestinal:  Positive for nausea and vomiting.  Neurological:  Positive for dizziness.  All other systems reviewed and are negative.   Physical Exam Updated Vital Signs BP (!) 141/82   Pulse 60   Temp (!) 97.3 F (36.3 C) (Oral)   Resp 18   Ht 5' 6.5" (1.689 m)   Wt 99.8 kg   SpO2 99%   BMI 34.98 kg/m  Physical Exam Vitals and nursing note reviewed.  Constitutional:      Appearance: Normal appearance.  HENT:     Head: Normocephalic and atraumatic.     Mouth/Throat:     Mouth: Mucous membranes are moist.     Pharynx: Oropharynx is clear.     Comments: Tongue is atraumatic, oropharynx without erythema, exudate Eyes:     General:        Right eye: No discharge.        Left eye: No discharge.     Extraocular Movements: Extraocular movements intact.     Conjunctiva/sclera: Conjunctivae normal.  Pupils: Pupils are equal, round, and reactive to light.  Cardiovascular:     Rate and Rhythm: Normal rate and regular rhythm.     Pulses: Normal pulses.     Heart sounds: Normal heart sounds. No murmur heard.    No friction rub. No gallop.  Pulmonary:     Effort: Pulmonary effort is normal. No respiratory distress.     Breath sounds: Normal breath sounds.  Abdominal:     General: Abdomen is flat.     Palpations: Abdomen is soft.     Tenderness: There is no abdominal tenderness.  Musculoskeletal:     Cervical back: Normal range of motion. No rigidity.  Skin:    General: Skin is warm and dry.     Coloration: Skin is not jaundiced or pale.     Findings: No erythema.  Neurological:     General: No focal deficit present.     Mental Status: She is alert and oriented to  person, place, and time. Mental status is at baseline.     Cranial Nerves: No cranial nerve deficit.     Motor: No weakness.     Gait: Gait normal.  Psychiatric:        Mood and Affect: Mood normal.     ED Results / Procedures / Treatments   Labs (all labs ordered are listed, but only abnormal results are displayed) Labs Reviewed  BASIC METABOLIC PANEL - Abnormal; Notable for the following components:      Result Value   Sodium 133 (*)    Glucose, Bld 148 (*)    All other components within normal limits  URINALYSIS, ROUTINE W REFLEX MICROSCOPIC - Abnormal; Notable for the following components:   Color, Urine AMBER (*)    APPearance TURBID (*)    Hgb urine dipstick SMALL (*)    Protein, ur 30 (*)    Leukocytes,Ua MODERATE (*)    Bacteria, UA MANY (*)    All other components within normal limits  CBG MONITORING, ED - Abnormal; Notable for the following components:   Glucose-Capillary 123 (*)    All other components within normal limits  CBC  TROPONIN I (HIGH SENSITIVITY)  TROPONIN I (HIGH SENSITIVITY)    EKG EKG Interpretation Date/Time:  Tuesday June 19 2023 23:31:18 EST Ventricular Rate:  60 PR Interval:  154 QRS Duration:  78 QT Interval:  432 QTC Calculation: 432 R Axis:   69  Text Interpretation: Sinus rhythm with occasional Premature ventricular complexes Otherwise normal ECG Confirmed by Tilden Fossa [phone]) on 06/20/2023 5:57:30 AM  Radiology DG Chest 2 View  Result Date: 06/20/2023 CLINICAL DATA:  Near syncope EXAM: CHEST - 2 VIEW COMPARISON:  11/22/2022 FINDINGS: The heart size and mediastinal contours are within normal limits. Both lungs are clear. Kyphosis of the spine with mild chronic wedging of lower thoracic vertebra. IMPRESSION: No active cardiopulmonary disease. Electronically Signed   By: Jasmine Pang M.D.   On: 06/20/2023 01:31    Procedures Procedures    Medications Ordered in ED Medications - No data to display  ED Course/ Medical  Decision Making/ A&P Clinical Course as of 06/20/23 0625  Wed Jun 20, 2023  0546 Platelets: 290 [CB]  0605 Urinalysis, Routine w reflex microscopic -Urine, Clean Catch(!) UTI probable cause for the feeling lightheadedness causing the nausea leading to the vomiting.  [CB]    Clinical Course User Index [CB] Lunette Stands, PA-C  Medical Decision Making Amount and/or Complexity of Data Reviewed Labs: ordered. Decision-making details documented in ED Course.     Patient presents to the ED for concern of near syncope, this involves an extensive number of treatment options, and is a complaint that carries with it a high risk of complications and morbidity.  The differential diagnosis includes EtOH intoxication, seizure, gastritis, UTI    Additional history obtained:  Additional history obtained from  EMS, Family, Nursing, Outside Medical Records, and Past Admission    Lab Tests:  I Ordered, and personally interpreted labs.  The pertinent results include:      Imaging Studies ordered:  I ordered imaging studies including chest x-ray I independently visualized and interpreted imaging which showed unremarkable I agree with the radiologist interpretation      Medicines ordered and prescription drug management:  I ordered medication including nitrofurantoin for UTI to be picked up at her pharmacy I have reviewed the patients home medicines and have made adjustments as needed   Test Considered:  CT head, CT abdomen --unnecessary as patient was entirely asymptomatic upon arrival to the ED.   Problem List / ED Course:  UTI --patient 57 year old female presented to ED today complaining of near syncope and vomiting.  This happened after she was drinking alcohol and smoking outside.  She felt this once before point when we on its own.  She called EMS because she felt very lightheaded.  But after she was picked up by EMS to that she felt  incredibly better and by time she got to the ER felt "never better".  Physical exam was entirely unremarkable.  Labs were drawn were unremarkable.  UA was done which showed UTI.  Chest x-ray normal.  Patient expressed desire to go home.  Due to patient being entirely asymptomatic upon arrival to the ER and during evaluation, patient stable enough to be discharged with nitrofurantoin outpatient follow-up with PCP.  Patient told of strict return to ER precautions.  Patient discharged.  Dispostion:  After consideration of the diagnostic results and the patients response to treatment, I feel that the patent would benefit from treatment noted as above and discharge..   Final Clinical Impression(s) / ED Diagnoses Final diagnoses:  Acute cystitis without hematuria    Rx / DC Orders ED Discharge Orders          Ordered    nitrofurantoin, macrocrystal-monohydrate, (MACROBID) 100 MG capsule  2 times daily        06/20/23 0611              Lunette Stands, PA-C 06/20/23 1610    Tilden Fossa, MD 06/21/23 1016

## 2023-06-20 NOTE — ED Notes (Signed)
Discharge instructions provided by edp was discussed with pt. Pt verbalized understanding with no additional questions at this time. Pt to go home with s/o at bedside. Pharmacy verified with pt
